# Patient Record
Sex: Male | Born: 1965 | Race: White | Hispanic: No | Marital: Married | Smoking: Never smoker
Health system: Southern US, Community
[De-identification: ages and names within clinical notes are randomized; demographics above are authoritative.]

## PROBLEM LIST (undated history)

## (undated) DIAGNOSIS — R12 Heartburn: Secondary | ICD-10-CM

## (undated) DIAGNOSIS — I10 Essential (primary) hypertension: Secondary | ICD-10-CM

## (undated) DIAGNOSIS — R202 Paresthesia of skin: Secondary | ICD-10-CM

## (undated) DIAGNOSIS — J301 Allergic rhinitis due to pollen: Secondary | ICD-10-CM

## (undated) DIAGNOSIS — M130 Polyarthritis, unspecified: Secondary | ICD-10-CM

## (undated) DIAGNOSIS — E739 Lactose intolerance, unspecified: Secondary | ICD-10-CM

## (undated) DIAGNOSIS — I7300 Raynaud's syndrome without gangrene: Secondary | ICD-10-CM

## (undated) DIAGNOSIS — R630 Anorexia: Secondary | ICD-10-CM

## (undated) DIAGNOSIS — R41840 Attention and concentration deficit: Secondary | ICD-10-CM

## (undated) DIAGNOSIS — E7800 Pure hypercholesterolemia, unspecified: Secondary | ICD-10-CM

## (undated) DIAGNOSIS — R5383 Other fatigue: Secondary | ICD-10-CM

## (undated) DIAGNOSIS — G629 Polyneuropathy, unspecified: Secondary | ICD-10-CM

## (undated) HISTORY — DX: Lactose intolerance, unspecified: E73.9

## (undated) HISTORY — DX: Raynaud's syndrome without gangrene: I73.00

## (undated) HISTORY — PX: PROSTATE BIOPSY: SHX241

## (undated) HISTORY — DX: Pure hypercholesterolemia, unspecified: E78.00

## (undated) HISTORY — PX: MOUTH SURGERY: SHX715

## (undated) HISTORY — DX: Essential (primary) hypertension: I10

## (undated) HISTORY — DX: Heartburn: R12

## (undated) HISTORY — DX: Anorexia: R63.0

## (undated) HISTORY — DX: Other fatigue: R53.83

## (undated) HISTORY — DX: Allergic rhinitis due to pollen: J30.1

## (undated) HISTORY — DX: Attention and concentration deficit: R41.840

## (undated) HISTORY — DX: Polyneuropathy, unspecified: G62.9

## (undated) HISTORY — DX: Paresthesia of skin: R20.2

## (undated) HISTORY — DX: Polyarthritis, unspecified: M13.0

---

## 1999-01-13 ENCOUNTER — Emergency Department (HOSPITAL_COMMUNITY): Admission: EM | Admit: 1999-01-13 | Discharge: 1999-01-13 | Payer: Self-pay | Admitting: Emergency Medicine

## 1999-01-13 ENCOUNTER — Encounter: Payer: Self-pay | Admitting: Emergency Medicine

## 1999-07-03 ENCOUNTER — Emergency Department (HOSPITAL_COMMUNITY): Admission: EM | Admit: 1999-07-03 | Discharge: 1999-07-03 | Payer: Self-pay | Admitting: Emergency Medicine

## 1999-07-03 ENCOUNTER — Encounter: Payer: Self-pay | Admitting: Emergency Medicine

## 2005-03-14 ENCOUNTER — Emergency Department (HOSPITAL_COMMUNITY): Admission: EM | Admit: 2005-03-14 | Discharge: 2005-03-14 | Payer: Self-pay | Admitting: Emergency Medicine

## 2006-05-26 ENCOUNTER — Encounter: Admission: RE | Admit: 2006-05-26 | Discharge: 2006-05-26 | Payer: Self-pay | Admitting: Internal Medicine

## 2013-08-29 ENCOUNTER — Other Ambulatory Visit: Payer: Self-pay | Admitting: Internal Medicine

## 2013-08-29 DIAGNOSIS — K30 Functional dyspepsia: Secondary | ICD-10-CM

## 2013-08-30 ENCOUNTER — Ambulatory Visit
Admission: RE | Admit: 2013-08-30 | Discharge: 2013-08-30 | Disposition: A | Discharge status: Home / Self Care | Payer: BC Managed Care – PPO | Source: Ambulatory Visit | Attending: Internal Medicine | Admitting: Internal Medicine

## 2013-08-30 DIAGNOSIS — K30 Functional dyspepsia: Secondary | ICD-10-CM

## 2017-10-16 ENCOUNTER — Emergency Department (HOSPITAL_COMMUNITY)
Admission: EM | Admit: 2017-10-16 | Discharge: 2017-10-16 | Disposition: A | Discharge status: Home / Self Care | Payer: BC Managed Care – PPO | Attending: Emergency Medicine | Admitting: Emergency Medicine

## 2017-10-16 ENCOUNTER — Other Ambulatory Visit: Payer: Self-pay

## 2017-10-16 ENCOUNTER — Encounter (HOSPITAL_COMMUNITY): Payer: Self-pay | Admitting: Emergency Medicine

## 2017-10-16 DIAGNOSIS — S61412A Laceration without foreign body of left hand, initial encounter: Secondary | ICD-10-CM | POA: Insufficient documentation

## 2017-10-16 DIAGNOSIS — Y929 Unspecified place or not applicable: Secondary | ICD-10-CM | POA: Insufficient documentation

## 2017-10-16 DIAGNOSIS — W260XXA Contact with knife, initial encounter: Secondary | ICD-10-CM | POA: Insufficient documentation

## 2017-10-16 DIAGNOSIS — Y998 Other external cause status: Secondary | ICD-10-CM | POA: Diagnosis not present

## 2017-10-16 DIAGNOSIS — Y9389 Activity, other specified: Secondary | ICD-10-CM | POA: Insufficient documentation

## 2017-10-16 MED ORDER — LIDOCAINE HCL (PF) 1 % IJ SOLN
10.0000 mL | Freq: Once | INTRAMUSCULAR | Status: AC
  Administered 2017-10-16: 10 mL
  Filled 2017-10-16: qty 10

## 2017-10-16 NOTE — Discharge Instructions (Signed)
Treatment: Keep your wound dry and dressing applied until this time tomorrow. After 24 hours, you may wash with warm soapy water. Dry and apply antibiotic ointment and clean dressing. Do this daily until your sutures are removed.  Wear your splint for the first 3 to 4 days to help prevent sutures pulling out.  Follow-up: Please follow-up with your primary care provider or return to emergency department in 10 days for suture removal. Be aware of signs of infection: fever, increasing pain, redness, swelling, drainage from the area. Please call your primary care provider or return to emergency department if you develop any of these symptoms or if any of the sutures come out prior to removal. Please return to the emergency department if you develop any other new or worsening symptoms.

## 2017-10-16 NOTE — ED Provider Notes (Signed)
[facility] EMERGENCY DEPARTMENT Provider Note   CSN: [id number] Arrival date & time: 10/16/17  1432     History   Chief Complaint Chief Complaint  Patient presents with  . Extremity Laceration    HPI Hector Short is a 52 y.o. male who is previously healthy who presents with left hand laceration.  Patient reports he was carving wooden bird with about a 1 inch gauge with his right hand and slipped and hit his left hand.  The gauge was clean and he denies possibility of foreign body.  He had trouble getting the bleeding stopping at home.   He denies any difficulty moving his thumb or fingers, numbness or tingling. His tetanus is up-to-date.  Patient reports when he saw the blood, he became clammy and sweaty, felt tired, and passed out for a short time.  He reports that has happened before when he has seen blood.  He feels back to normal now.  HPI  History reviewed. No pertinent past medical history.  There are no active problems to display for this patient.   History reviewed. No pertinent surgical history.      Home Medications    Prior to Admission medications   Not on File    Family History No family history on file.  Social History Social History   Tobacco Use  . Smoking status: Never Smoker  . Smokeless tobacco: Never Used  Substance Use Topics  . Alcohol use: Yes  . Drug use: Not Currently     Allergies   Patient has no allergy information on record.   Review of Systems Review of Systems  Musculoskeletal: Negative for arthralgias.  Skin: Positive for wound.  Neurological: Negative for numbness.     Physical Exam Updated Vital Signs BP 117/81 (BP Location: Right Arm)   Pulse 66   Temp 98 F (36.7 C) (Oral)   Resp 16   Ht 5\' 6"  (1.676 m)   Wt 77.1 kg   SpO2 100%   BMI 27.44 kg/m   Physical Exam  Constitutional: He appears well-developed and well-nourished. No distress.  HENT:  Head: Normocephalic and  atraumatic.  Mouth/Throat: Oropharynx is clear and moist. No oropharyngeal exudate.  Eyes: Pupils are equal, round, and reactive to light. Conjunctivae are normal. Right eye exhibits no discharge. Left eye exhibits no discharge. No scleral icterus.  Neck: Normal range of motion. Neck supple. No thyromegaly present.  Cardiovascular: Normal rate, regular rhythm, normal heart sounds and intact distal pulses. Exam reveals no gallop and no friction rub.  No murmur heard. Pulmonary/Chest: Effort normal and breath sounds normal. No stridor. No respiratory distress. He has no wheezes. He has no rales.  Musculoskeletal: He exhibits no edema.       Hands: 1 inch laceration as below, full range of motion of the thumb and all digits with flexion, extension, abduction, abduction, sensation intact, cap refill less than 2 seconds, no bony tenderness; radial pulses intact  Lymphadenopathy:    He has no cervical adenopathy.  Neurological: He is alert. Coordination normal.  Skin: Skin is warm and dry. No rash noted. He is not diaphoretic. No pallor.  Psychiatric: He has a normal mood and affect.  Nursing note and vitals reviewed.    ED Treatments / Results  Labs (all labs ordered are listed, but only abnormal results are displayed) Labs Reviewed - No data to display  EKG None  Radiology No results found.  Procedures .Marland KitchenLaceration Repair Date/Time: 10/16/2017 4:06 PM  Performed by: Frederica Kuster, PA-C Authorized by: Frederica Kuster, PA-C   Consent:    Consent obtained:  Verbal   Consent given by:  Patient   Risks discussed:  Infection, pain and poor cosmetic result   Alternatives discussed:  No treatment Anesthesia (see MAR for exact dosages):    Anesthesia method:  Local infiltration   Local anesthetic:  Lidocaine 1% w/o epi Laceration details:    Location:  Hand   Hand location:  L palm   Length (cm):  2.5 Repair type:    Repair type:  Simple Pre-procedure details:    Preparation:   Patient was prepped and draped in usual sterile fashion Exploration:    Hemostasis achieved with:  Direct pressure   Wound exploration: wound explored through full range of motion and entire depth of wound probed and visualized     Wound extent: no foreign bodies/material noted, no muscle damage noted, no nerve damage noted and no tendon damage noted     Contaminated: no   Treatment:    Area cleansed with:  Saline   Amount of cleaning:  Standard   Irrigation solution:  Sterile saline   Irrigation volume:  200 mL   Irrigation method:  Syringe   Visualized foreign bodies/material removed: no   Skin repair:    Repair method:  Sutures   Suture size:  5-0   Wound skin closure material used: Ethilon.   Suture technique:  Simple interrupted   Number of sutures:  5 Approximation:    Approximation:  Close Post-procedure details:    Dressing:  Antibiotic ointment, splint for protection and non-adherent dressing (gauze roll)   Patient tolerance of procedure:  Tolerated well, no immediate complications   (including critical care time)  Medications Ordered in ED Medications  lidocaine (PF) (XYLOCAINE) 1 % injection 10 mL (has no administration in time range)     Initial Impression / Assessment and Plan / ED Course  I have reviewed the triage vital signs and the nursing notes.  Pertinent labs & imaging results that were available during my care of the patient were reviewed by me and considered in my medical decision making (see chart for details).     Patient with laceration to the webspace between his thumb and index finger on the left hand tetanus UTD.  There is normal range of motion and full depth of wound visualized in a bloodless field without tendon or vessel damage.  Sensation intact.  Laceration occurred < 12 hours prior to repair. Discussed laceration care with pt and answered questions.  Patient placed in thumb spica splint for protection.  Pt to f-u for suture removal in 10 days  and wound check sooner should there be signs of dehiscence or infection.  Patient understands and agrees with plan.  Patient vitals stable throughout ED course and discharged in satisfactory condition.  Pt is hemodynamically stable with no complaints prior to dc.     Final Clinical Impressions(s) / ED Diagnoses   Final diagnoses:  Laceration of left hand without foreign body, initial encounter    ED Discharge Orders    None       Frederica Kuster, PA-C 10/16/17 1610    Julianne Rice, MD 10/24/17 [phone]

## 2017-10-16 NOTE — ED Triage Notes (Signed)
Pt. Stated, I cut my hand at the bottom of the thumb about a hour ago. About 1 Inch.  Laceration between thumb and index finger.

## 2018-06-16 ENCOUNTER — Other Ambulatory Visit: Payer: Self-pay | Admitting: Internal Medicine

## 2018-06-16 ENCOUNTER — Ambulatory Visit
Admission: RE | Admit: 2018-06-16 | Discharge: 2018-06-16 | Disposition: A | Discharge status: Home / Self Care | Payer: BC Managed Care – PPO | Source: Ambulatory Visit | Attending: Internal Medicine | Admitting: Internal Medicine

## 2018-06-16 DIAGNOSIS — R05 Cough: Secondary | ICD-10-CM

## 2018-06-16 DIAGNOSIS — R059 Cough, unspecified: Secondary | ICD-10-CM

## 2020-08-27 ENCOUNTER — Ambulatory Visit
Admission: RE | Admit: 2020-08-27 | Discharge: 2020-08-27 | Disposition: A | Discharge status: Home / Self Care | Payer: BC Managed Care – PPO | Source: Ambulatory Visit | Attending: Physician Assistant | Admitting: Physician Assistant

## 2020-08-27 ENCOUNTER — Other Ambulatory Visit: Payer: Self-pay

## 2020-08-27 ENCOUNTER — Other Ambulatory Visit: Payer: Self-pay | Admitting: Physician Assistant

## 2020-08-27 DIAGNOSIS — M5412 Radiculopathy, cervical region: Secondary | ICD-10-CM

## 2020-09-02 ENCOUNTER — Other Ambulatory Visit: Payer: Self-pay

## 2020-09-02 ENCOUNTER — Other Ambulatory Visit: Payer: Self-pay | Admitting: Internal Medicine

## 2020-09-02 ENCOUNTER — Ambulatory Visit
Admission: RE | Admit: 2020-09-02 | Discharge: 2020-09-02 | Disposition: A | Discharge status: Home / Self Care | Payer: BC Managed Care – PPO | Source: Ambulatory Visit | Attending: Internal Medicine | Admitting: Internal Medicine

## 2020-09-02 DIAGNOSIS — R5382 Chronic fatigue, unspecified: Secondary | ICD-10-CM

## 2020-10-03 ENCOUNTER — Other Ambulatory Visit: Payer: Self-pay | Admitting: Internal Medicine

## 2020-10-03 DIAGNOSIS — R200 Anesthesia of skin: Secondary | ICD-10-CM

## 2020-10-18 ENCOUNTER — Other Ambulatory Visit: Payer: Self-pay

## 2020-10-18 ENCOUNTER — Ambulatory Visit
Admission: RE | Admit: 2020-10-18 | Discharge: 2020-10-18 | Disposition: A | Discharge status: Home / Self Care | Payer: BC Managed Care – PPO | Source: Ambulatory Visit | Attending: Internal Medicine | Admitting: Internal Medicine

## 2020-10-18 DIAGNOSIS — R200 Anesthesia of skin: Secondary | ICD-10-CM

## 2020-10-18 MED ORDER — GADOBENATE DIMEGLUMINE 529 MG/ML IV SOLN
15.0000 mL | Freq: Once | INTRAVENOUS | Status: AC | PRN
  Administered 2020-10-18: 15 mL via INTRAVENOUS

## 2020-12-08 ENCOUNTER — Telehealth: Payer: Self-pay | Admitting: Neurology

## 2020-12-08 NOTE — Telephone Encounter (Signed)
Dr Laurann Montana (who states he has spoken with Dr Rexene Alberts about this pt once before) he is asking for a call from Dr Rexene Alberts.  He is asking to be called at [phone] he states it is not urgent.

## 2021-01-22 ENCOUNTER — Ambulatory Visit: Payer: BC Managed Care – PPO | Admitting: Neurology

## 2021-01-22 ENCOUNTER — Other Ambulatory Visit: Payer: Self-pay

## 2021-01-22 ENCOUNTER — Encounter: Payer: Self-pay | Admitting: Neurology

## 2021-01-22 VITALS — BP 124/80 | HR 66 | Ht 66.0 in | Wt 161.8 lb

## 2021-01-22 DIAGNOSIS — R202 Paresthesia of skin: Secondary | ICD-10-CM | POA: Diagnosis not present

## 2021-01-22 DIAGNOSIS — D485 Neoplasm of uncertain behavior of skin: Secondary | ICD-10-CM | POA: Insufficient documentation

## 2021-01-22 DIAGNOSIS — I781 Nevus, non-neoplastic: Secondary | ICD-10-CM | POA: Insufficient documentation

## 2021-01-22 DIAGNOSIS — D2370 Other benign neoplasm of skin of unspecified lower limb, including hip: Secondary | ICD-10-CM | POA: Insufficient documentation

## 2021-01-22 DIAGNOSIS — L738 Other specified follicular disorders: Secondary | ICD-10-CM | POA: Insufficient documentation

## 2021-01-22 DIAGNOSIS — R209 Unspecified disturbances of skin sensation: Secondary | ICD-10-CM | POA: Insufficient documentation

## 2021-01-22 DIAGNOSIS — D225 Melanocytic nevi of trunk: Secondary | ICD-10-CM | POA: Insufficient documentation

## 2021-01-22 DIAGNOSIS — G9332 Myalgic encephalomyelitis/chronic fatigue syndrome: Secondary | ICD-10-CM | POA: Insufficient documentation

## 2021-01-22 DIAGNOSIS — J301 Allergic rhinitis due to pollen: Secondary | ICD-10-CM | POA: Insufficient documentation

## 2021-01-22 DIAGNOSIS — R630 Anorexia: Secondary | ICD-10-CM | POA: Insufficient documentation

## 2021-01-22 DIAGNOSIS — R41840 Attention and concentration deficit: Secondary | ICD-10-CM | POA: Insufficient documentation

## 2021-01-22 DIAGNOSIS — E739 Lactose intolerance, unspecified: Secondary | ICD-10-CM | POA: Insufficient documentation

## 2021-01-22 NOTE — Progress Notes (Addendum)
Chief Complaint  Patient presents with   New Patient (Initial Visit)    Rm 17. Alone. 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      ASSESSMENT AND PLAN  Hector Short is a 56 y.o. male  Intermittent paresthesia  Laboratory evaluations, including inflammatory markers, Lyme titer  Continue to observe his symptoms,  Addendum: Reviewed laboratory evaluation from [place] in February 2023, normal CBC, hemoglobin of 14.3, CMP, creatinine of 0.95, ESR of 1, TSH of 0.95, normal C-reactive protein,   DIAGNOSTIC DATA (LABS, IMAGING, TESTING) - I reviewed patient records, labs, notes, testing and imaging myself where available.   MEDICAL HISTORY:  Hector Short is a 56 year old male, seen in request by his primary care physician Dr. Laurann Montana, Jenny Reichmann, for evaluation of intermittent paresthesia, regular MRI of brain, initial evaluation was from January 22, 2021   I reviewed and summarized the referring note. 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  PHYSICAL EXAMNIATION:  Gen: NAD, conversant, well nourised, well groomed                     Cardiovascular: Regular rate rhythm, no peripheral edema, warm, nontender. Eyes: Conjunctivae clear without exudates or hemorrhage Neck: Supple, no carotid bruits. Pulmonary: Clear to auscultation bilaterally   NEUROLOGICAL EXAM:  MENTAL STATUS: Speech:    Speech is normal; fluent and spontaneous with normal comprehension.  Cognition:     Orientation to time, place and person     Normal recent and remote memory     Normal Attention span and concentration     Normal Language, naming, repeating,spontaneous speech     Fund of knowledge   CRANIAL NERVES: CN II: Visual fields are full to confrontation. Pupils are round equal and briskly reactive to light. CN III, IV, VI: extraocular movement are normal. No ptosis. CN V: Facial sensation is intact to light touch CN VII: Face is symmetric with normal eye closure  CN VIII: Hearing is normal to causal conversation. CN IX, X: Phonation is normal. CN XI: Head turning and shoulder shrug are intact  MOTOR: There is no pronator drift of out-stretched arms. Muscle bulk and tone are normal. Muscle strength is normal.  REFLEXES: Reflexes are 2+ and symmetric at the biceps, triceps, knees, and ankles. Plantar responses are flexor.  SENSORY: Intact to light  touch, pinprick and vibratory sensation are intact in fingers and toes.  COORDINATION: There is no trunk or limb dysmetria noted.  GAIT/STANCE: Posture is normal. Gait is steady with normal steps, base, arm swing, and turning. Heel and toe walking are normal. Tandem gait is normal.  Romberg is absent.  REVIEW OF  SYSTEMS:  Full 14 system review of systems performed and notable only for as above All other review of systems were negative.   ALLERGIES: Allergies  Allergen Reactions   Aspirin Other (See Comments)   Cephalexin     Other reaction(s): anaphylaxis   Penicillin G     Other reaction(s): Unknown   Penicillin G Benzathine     Other reaction(s): anaphylaxis    HOME MEDICATIONS: No current outpatient medications on file.   No current facility-administered medications for this visit.    PAST MEDICAL HISTORY: History reviewed. No pertinent past medical history.  PAST SURGICAL HISTORY: History reviewed. No pertinent surgical history.  FAMILY HISTORY: History reviewed. No pertinent family history.  SOCIAL HISTORY: Social History   Socioeconomic History   Marital status: Married    Spouse name: Not on file   Number of children: Not on file   Years of education: Not on file   Highest education level: Not on file  Occupational History   Not on file  Tobacco Use   Smoking status: Never   Smokeless tobacco: Never  Substance and Sexual Activity   Alcohol use: Not Currently   Drug use: Not Currently   Sexual activity: Not on file  Other Topics Concern   Not on file  Social History Narrative   Not on file   Social Determinants of Health   Financial Resource Strain: Not on file  Food Insecurity: Not on file  Transportation Needs: Not on file  Physical Activity: Not on file  Stress: Not on file  Social Connections: Not on file  Intimate Partner Violence: Not on file      Marcial Pacas, M.D. Ph.D.  [facility] [address], [place], [place] [address] Ph: [phone] Fax: [phone]  CC:  Lavone Orn, MD [place] [address] [place],   [address]  Lavone Orn, MD  +++++++++++++++++++++++++++++++++++++++++++++++++++++++++++++++++++++++++++++++++++++++++

## 2021-01-23 LAB — C-REACTIVE PROTEIN: CRP: 1 mg/L (ref 0–10)

## 2021-01-23 LAB — ANA W/REFLEX IF POSITIVE: Anti Nuclear Antibody (ANA): NEGATIVE

## 2021-01-23 LAB — LYME DISEASE SEROLOGY W/REFLEX: Lyme Total Antibody EIA: NEGATIVE

## 2021-01-23 LAB — SEDIMENTATION RATE: Sed Rate: 2 mm/hr (ref 0–30)

## 2021-01-27 ENCOUNTER — Telehealth: Payer: Self-pay | Admitting: *Deleted

## 2021-01-27 NOTE — Telephone Encounter (Signed)
Pt verified by name and DOB, results given per provider, pt voiced understanding all question answered. 

## 2021-01-27 NOTE — Telephone Encounter (Signed)
-----   Message from Marcial Pacas, MD sent at 01/26/2021  5:16 PM EST ----- Please call patient, laboratory evaluation showed no significant abnormalities.

## 2021-03-02 ENCOUNTER — Telehealth: Payer: Self-pay | Admitting: Neurology

## 2021-03-02 NOTE — Telephone Encounter (Signed)
I was able to talk with his PCP Dr. Lavone Orn, patient recently began to complains of constellation of symptoms, including paresthesia of limbs, even face, difficulty focusing. He is the Magazine features editor of biology department  Previous laboratory evaluation was normal,  Please give him a follow-up visit with me

## 2021-03-10 NOTE — Telephone Encounter (Signed)
Pt's wife has been called, the appointment has been scheduled with pt on wait list, wife is asking to be called on her cell @ [phone] if other appointments become available before.

## 2021-05-04 ENCOUNTER — Ambulatory Visit: Payer: BC Managed Care – PPO | Admitting: Neurology

## 2021-12-27 IMAGING — DX DG CERVICAL SPINE 2 OR 3 VIEWS
3 series · 3 of 3 positions shown · non-contrast
Comparison: None.

CLINICAL DATA: Cervical radiculopathy.

EXAM:
CERVICAL SPINE - 2-3 VIEW

[dg cervical spine 2 or 3 views (1 of 3)]
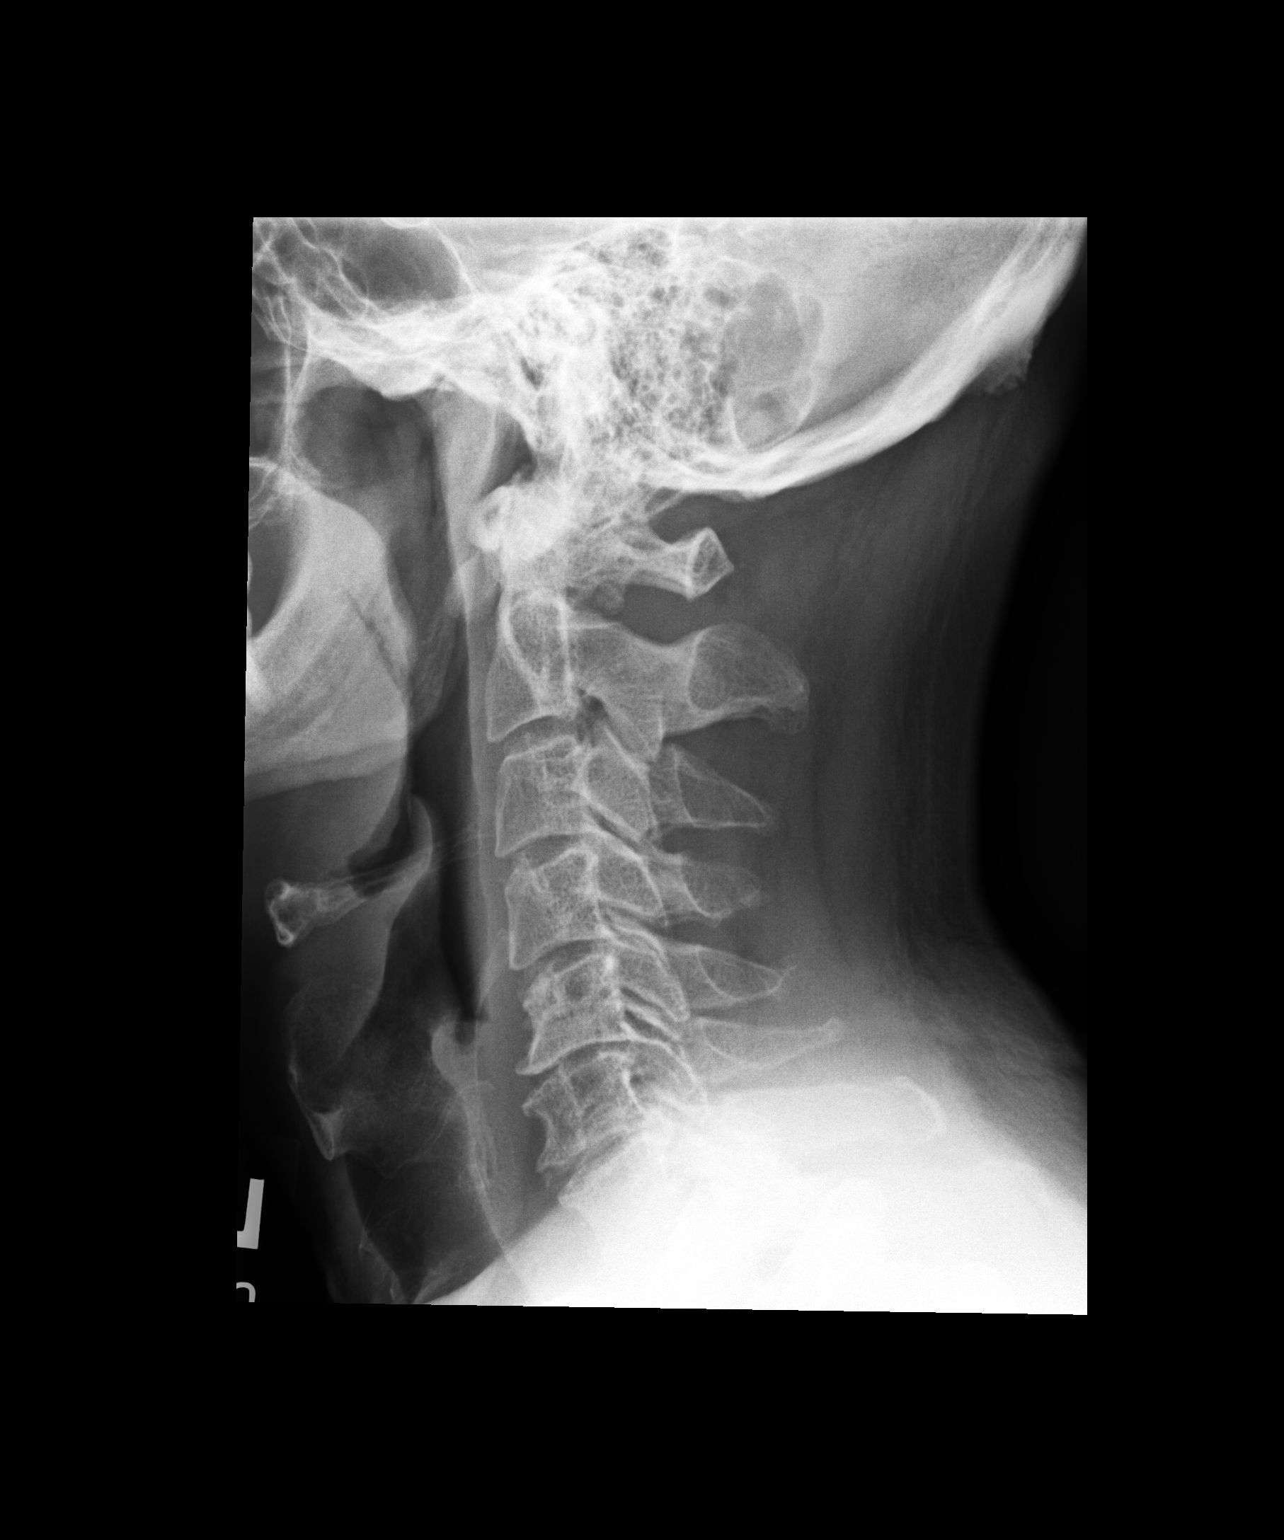

[dg cervical spine 2 or 3 views (2 of 3)]
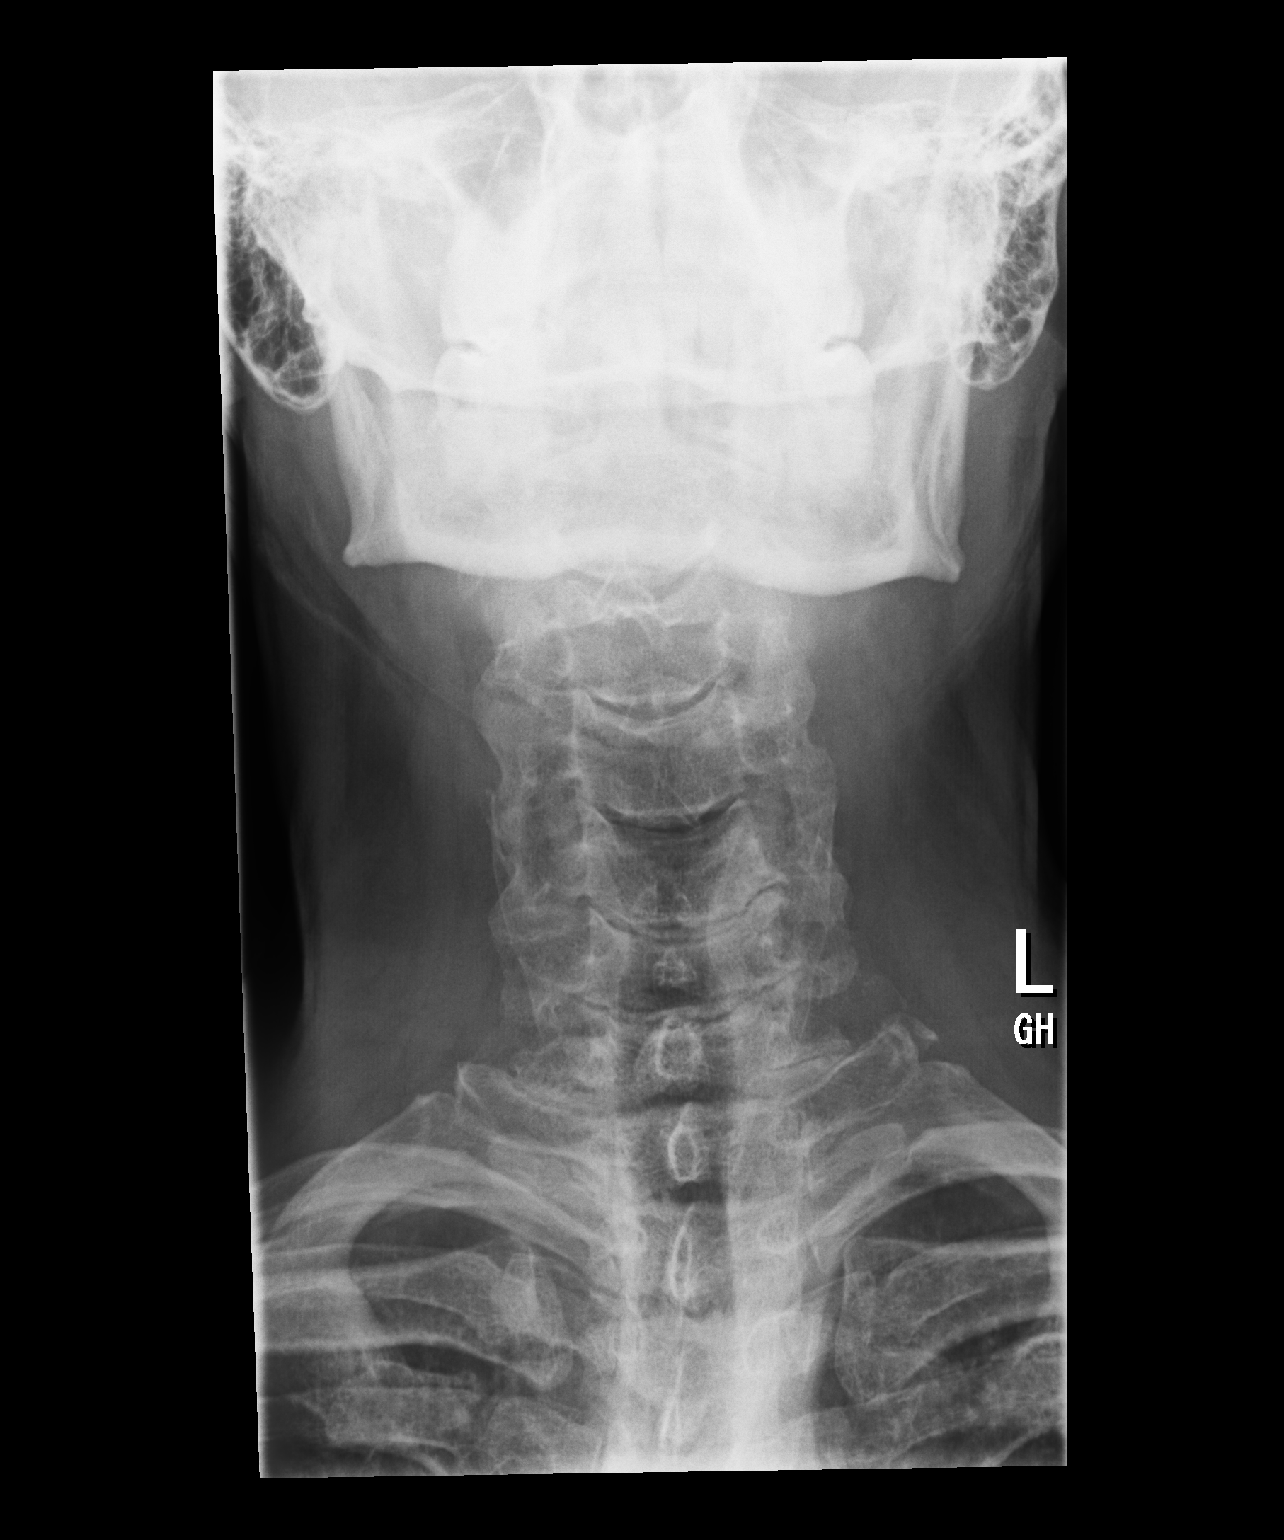

[dg cervical spine 2 or 3 views (3 of 3)]
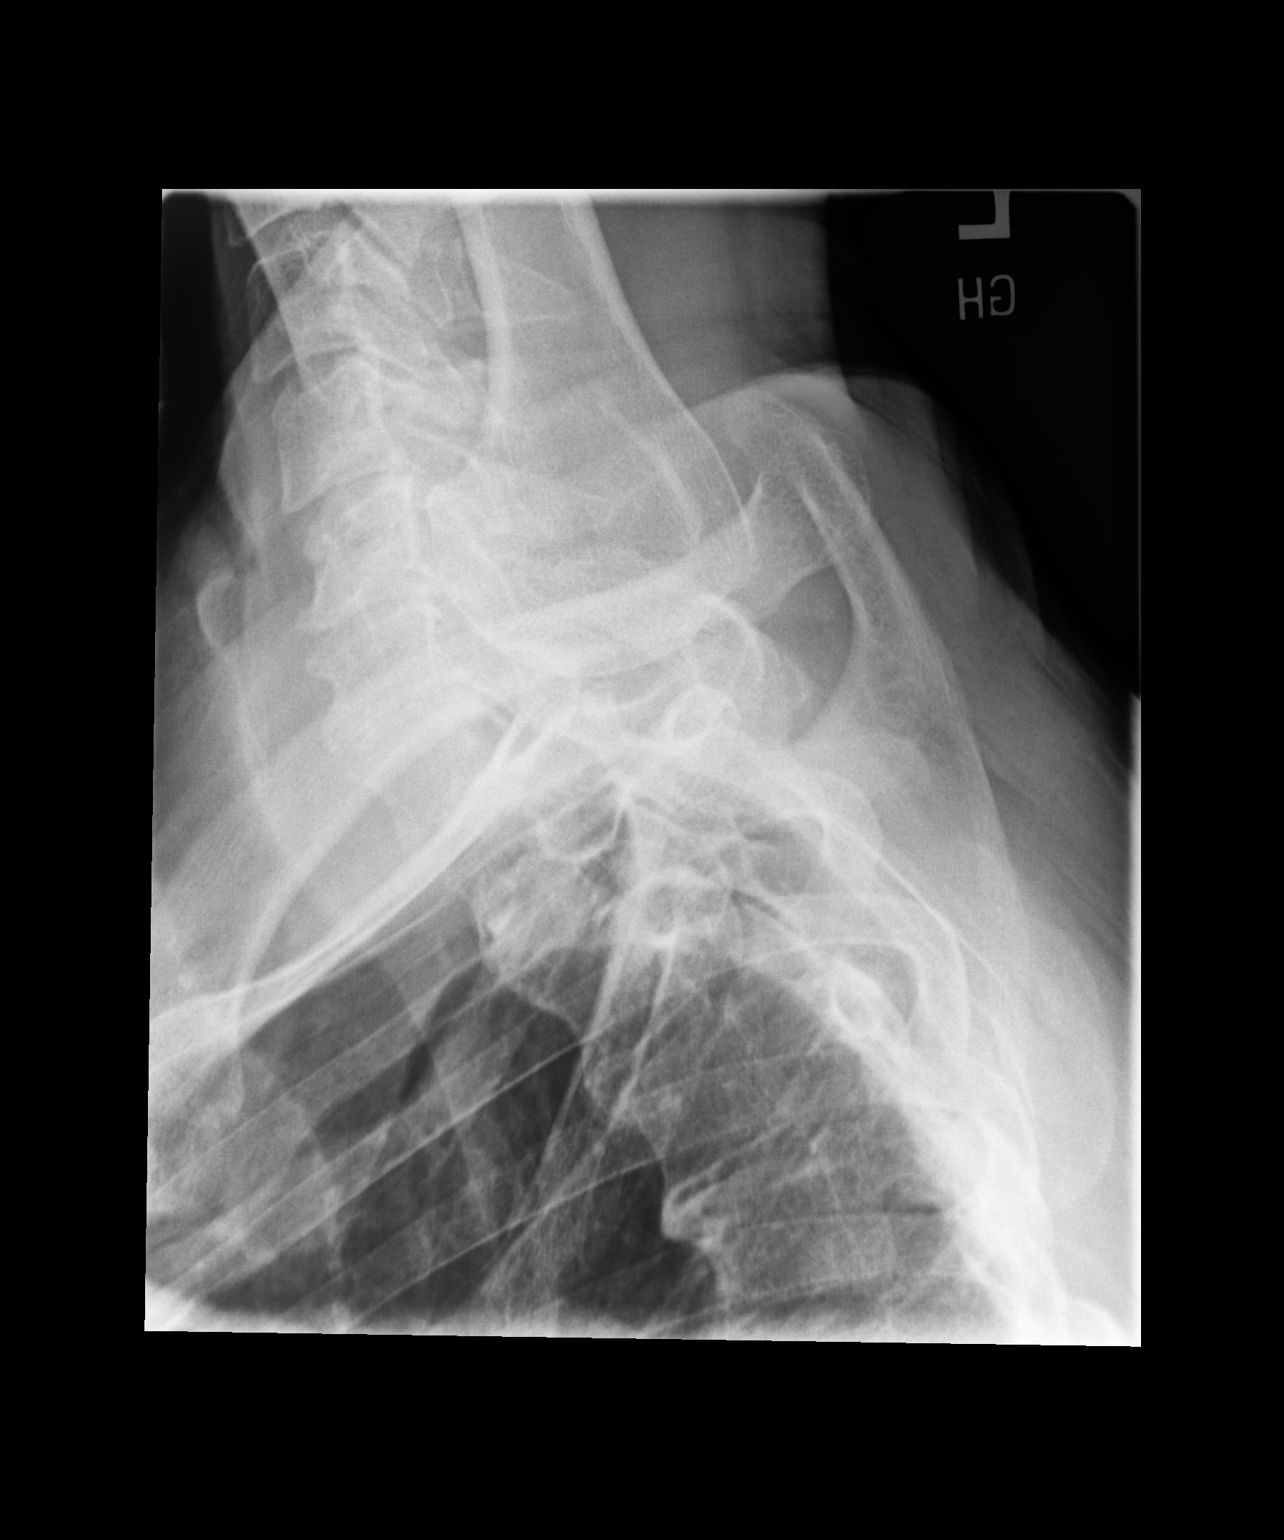

[3 of 3 positions shown; findings below may reference images not displayed]

FINDINGS: There is no acute fracture or subluxation of the cervical spine.
Multilevel degenerative changes with disc space narrowing and
endplate irregularity and spurring most prominent at C5-C6 and
C6-C7. The visualized posterior elements appear intact. The soft
tissues are unremarkable.
IMPRESSION: 1. No acute findings.
2. Multilevel degenerative changes.

## 2022-01-02 IMAGING — CR DG CHEST 2V
2 series · 2 of 2 positions shown · non-contrast
Comparison: June 16, 2018.

CLINICAL DATA: Chronic fatigue.

EXAM:
CHEST - 2 VIEW

[w chest pa]
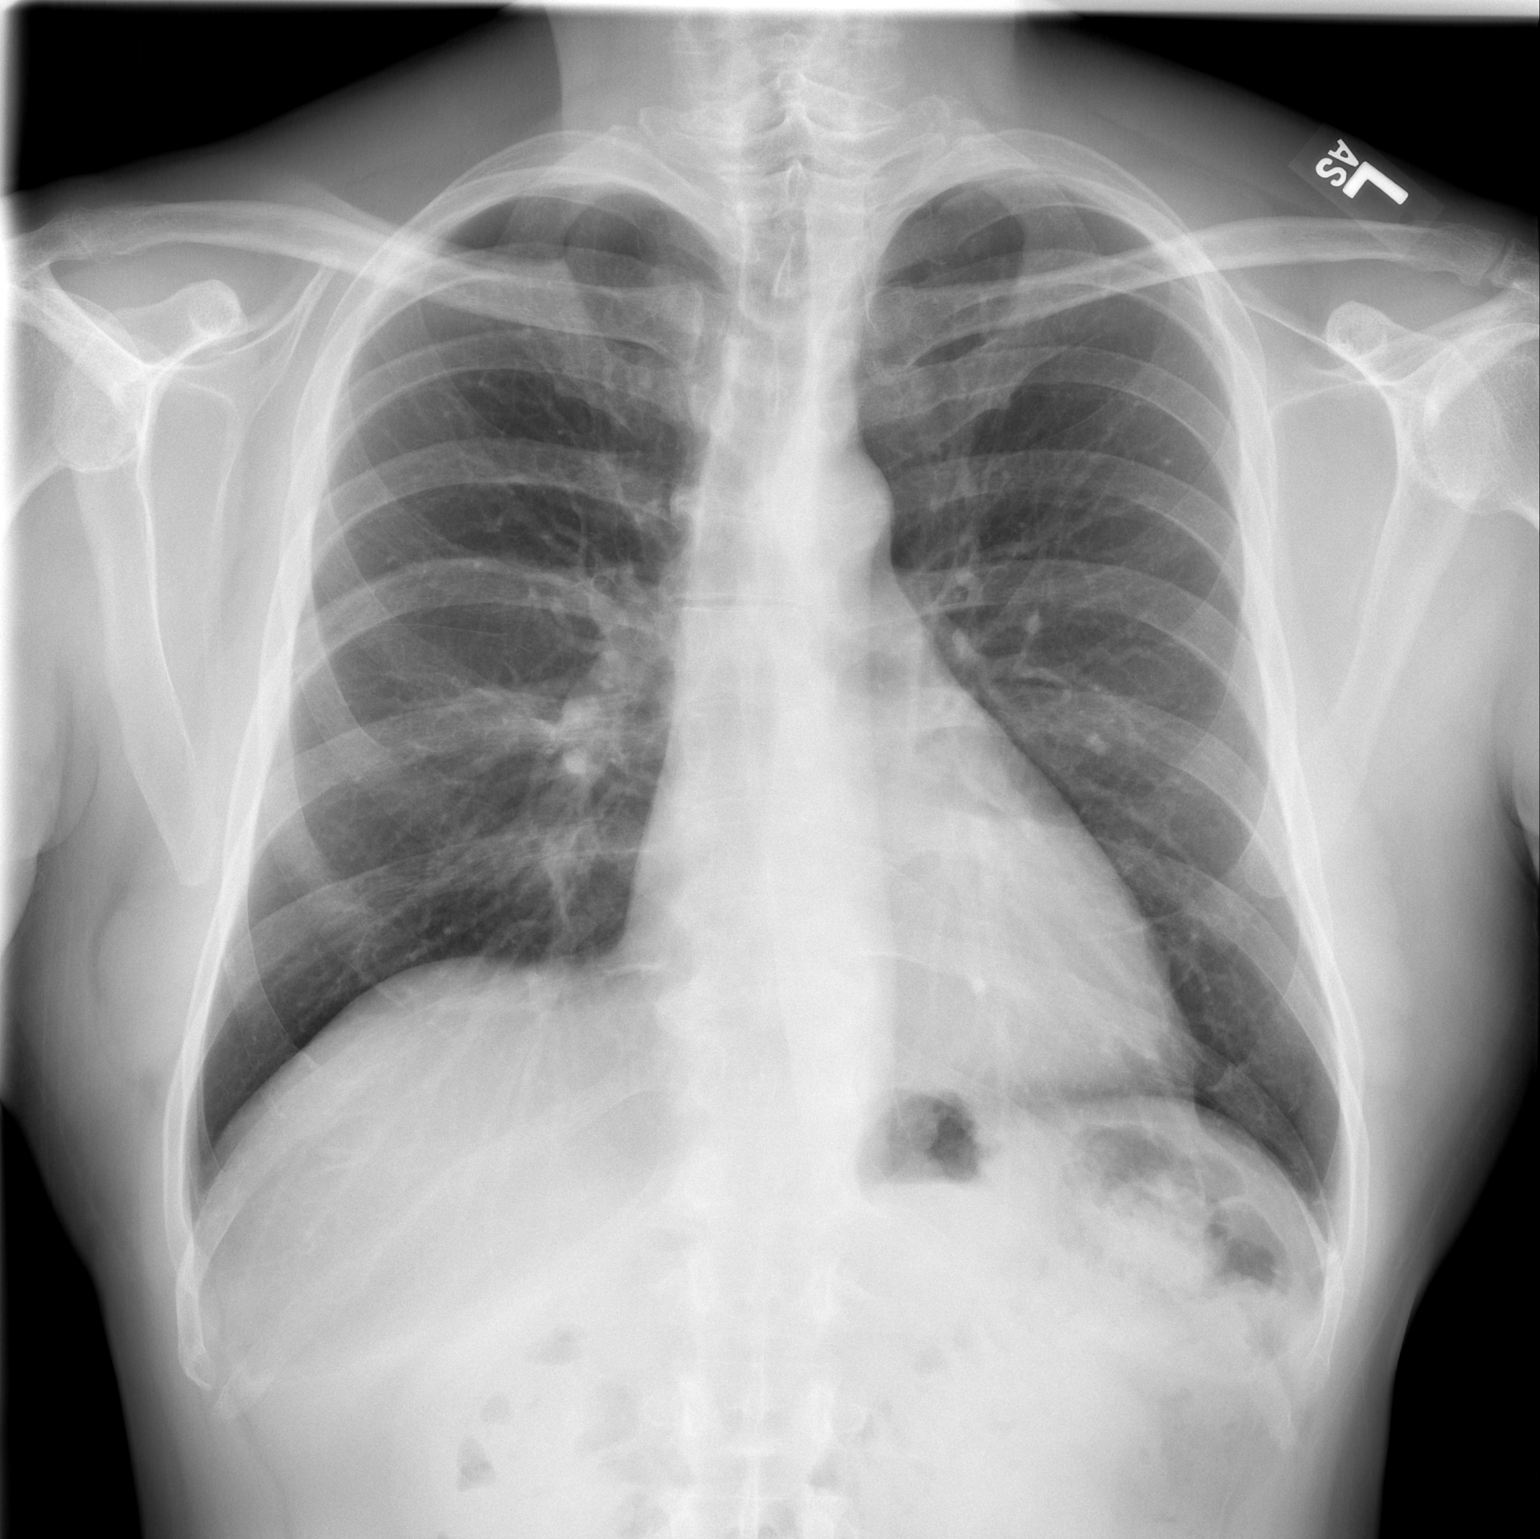

[w chest lat]
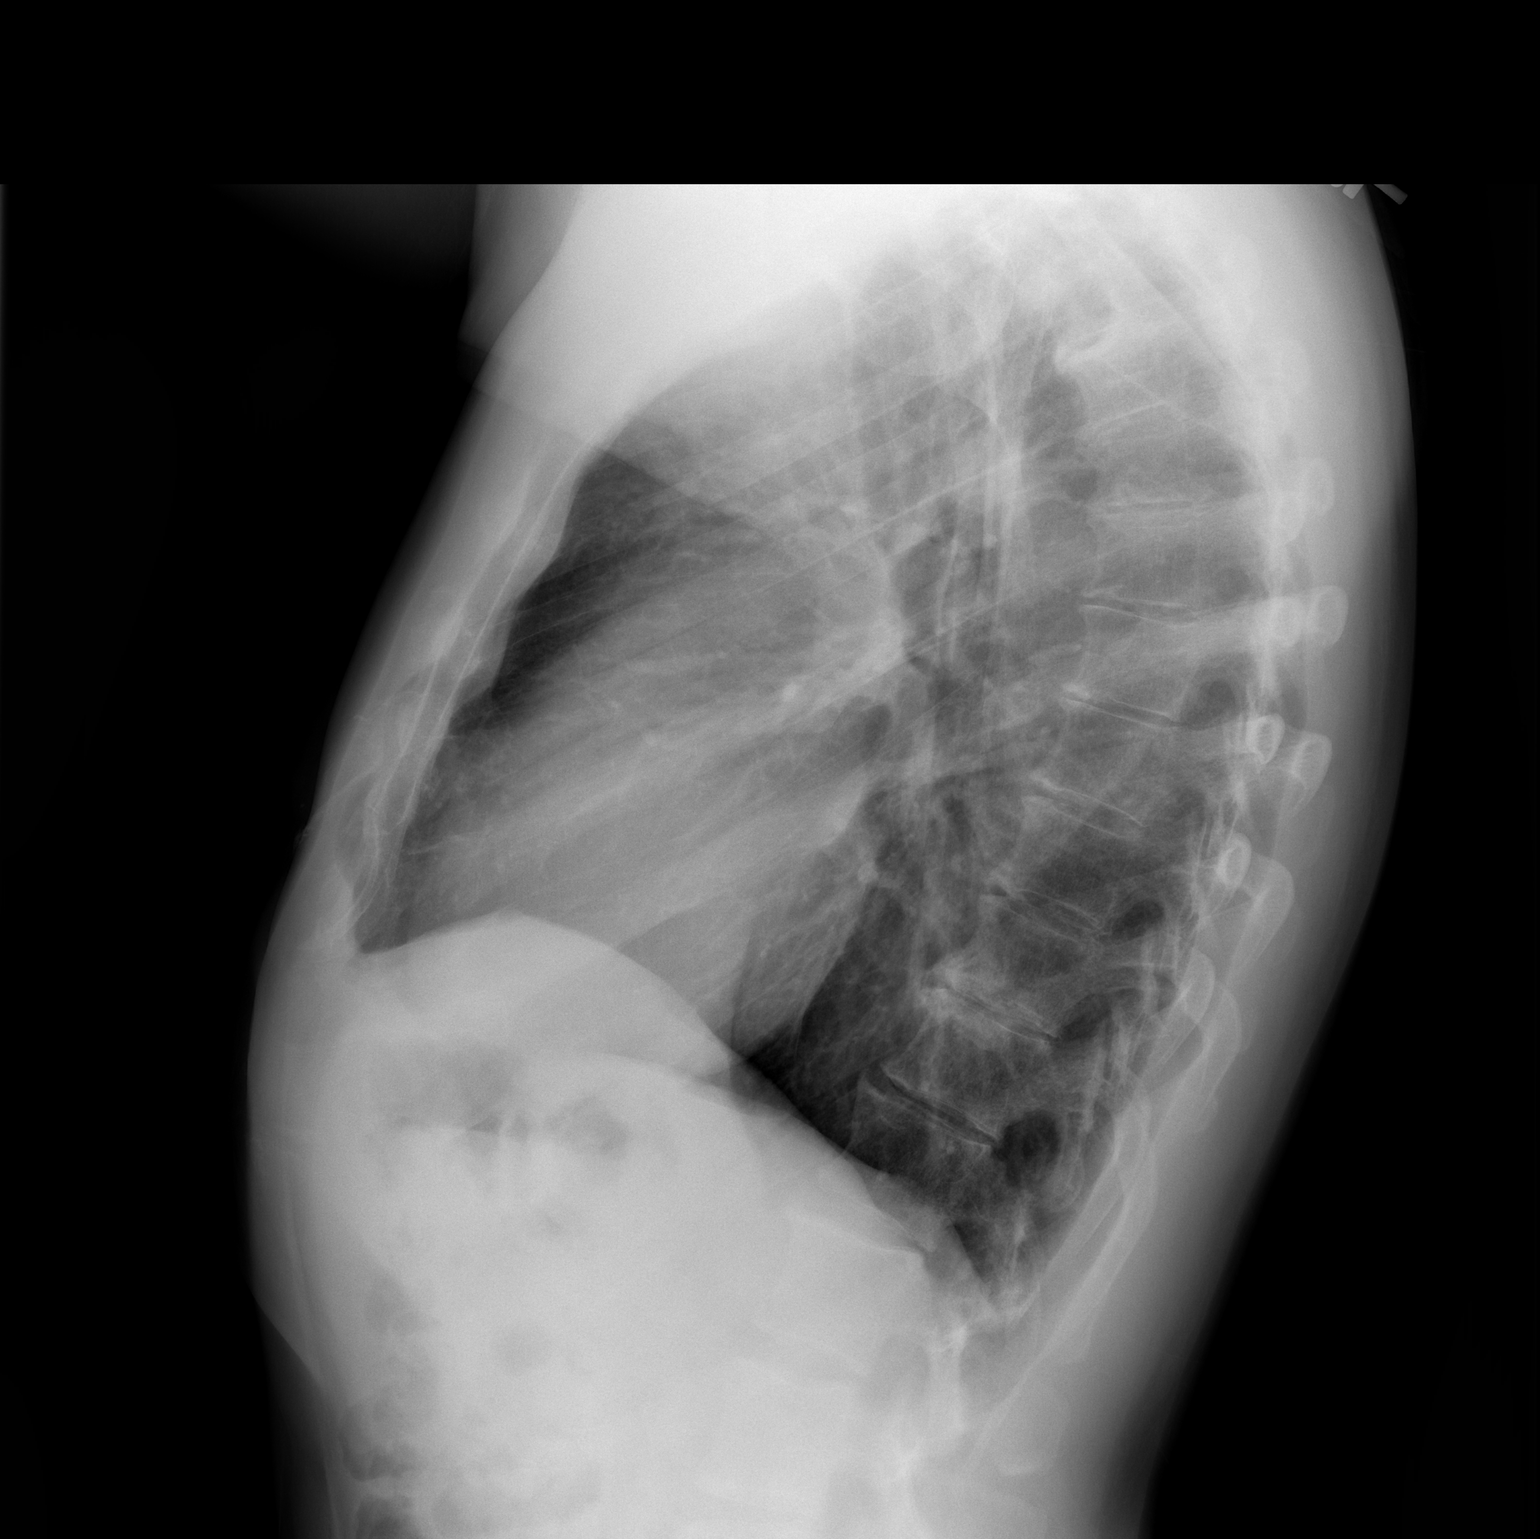

[2 of 2 positions shown; findings below may reference images not displayed]

FINDINGS: The heart size and mediastinal contours are within normal limits.
Both lungs are clear. The visualized skeletal structures are
unremarkable.
IMPRESSION: No active cardiopulmonary disease.

## 2022-03-02 ENCOUNTER — Other Ambulatory Visit: Payer: Self-pay

## 2022-03-02 ENCOUNTER — Encounter: Payer: Self-pay | Admitting: Physical Therapy

## 2022-03-02 ENCOUNTER — Ambulatory Visit: Payer: BC Managed Care – PPO | Attending: Neurology | Admitting: Physical Therapy

## 2022-03-02 DIAGNOSIS — R2689 Other abnormalities of gait and mobility: Secondary | ICD-10-CM | POA: Diagnosis present

## 2022-03-02 DIAGNOSIS — R2681 Unsteadiness on feet: Secondary | ICD-10-CM | POA: Insufficient documentation

## 2022-03-02 DIAGNOSIS — M6281 Muscle weakness (generalized): Secondary | ICD-10-CM | POA: Insufficient documentation

## 2022-03-02 NOTE — Therapy (Signed)
OUTPATIENT PHYSICAL THERAPY NEURO EVALUATION   Patient Name: Hector Short MRN: [id number] DOB:May 02, 1965, 57 y.o., male Today's Date: 03/02/2022   PCP: Lavone Orn, MD  REFERRING PROVIDER: Concepcion Living, MD   END OF SESSION:  PT End of Session - 03/02/22 0842     Visit Number 1    Number of Visits 12    Date for PT Re-Evaluation 04/09/22    Authorization Type BCBS    PT Start Time [phone]    PT Stop Time 0932    PT Time Calculation (min) 48 min    Activity Tolerance Patient tolerated treatment well    Behavior During Therapy [facility] for tasks assessed/performed             History reviewed. No pertinent past medical history. History reviewed. No pertinent surgical history. Patient Active Problem List   Diagnosis Date Noted   Allergic rhinitis due to pollen 01/22/2021   Attention and concentration deficit 01/22/2021   Benign neoplasm of skin of lower limb 01/22/2021   Chronic fatigue syndrome 01/22/2021   Lactose intolerance 01/22/2021   Loss of appetite 01/22/2021   Melanocytic nevi of trunk 01/22/2021   Neoplasm of uncertain behavior of skin 01/22/2021   Sebaceous hyperplasia 01/22/2021   Skin sensation disturbance 01/22/2021   Telangiectasia 01/22/2021   Paresthesia 01/22/2021    ONSET DATE: 12/30/2021 MD referral  REFERRING DIAG: M79.10 (ICD-10-CM) - Myalgia, unspecified site  THERAPY DIAG:  Muscle weakness (generalized)  Other abnormalities of gait and mobility  Unsteadiness on feet  Rationale for Evaluation and Treatment: Rehabilitation  SUBJECTIVE:                                                                                                                                                                                             SUBJECTIVE STATEMENT: Have had weird loss of sensation in fingers and feet.  Front part of toes have lack of sensation, especially on R side.  Have some pulling in my L quad.  Have odd sensations in my  quads.  Do yoga every day and that helps.   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  Per MD note:  family history of Charcot Marie-Tooth disease and polio; per pt report, family hx of ALS  PAIN:  Are you having pain? Yes: NPRS scale: 4/10 Pain location: tops of feet, radiates up my foot Pain description: burning Aggravating factors: walking Relieving factors: rest, taking pressure off feet  PRECAUTIONS: Fall  WEIGHT BEARING RESTRICTIONS: No  FALLS: Has patient fallen in last  6 months? No  LIVING ENVIRONMENT: Lives with: lives with their spouse Lives in: House/apartment Stairs: Yes: External: 2 steps; none Has following equipment at home: None  PLOF: Independent and Leisure: does yoga and works out mostly daily [employer]; is a Physiological scientist at [employer].  PATIENT GOALS: Pt's goals for therapy are to correct walking pattern to avoid compensations.  To improve flexibility.  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  Leg length measured below, from greater trochanter to medial malleolus 32 LLE 31.5 in RLE  LOWER EXTREMITY ROM:   A/ROM WFL in sitting; tends to have increased LLE internal rotation of foot in sitting.  LOWER EXTREMITY MMT:  MMT Right Eval Left Eval  Hip flexion 4+ 4  Hip extension    Hip abduction 4 4  Hip adduction    Hip internal rotation    Hip external rotation    Knee flexion 4+ 4+  Knee extension 4 4  Ankle dorsiflexion 4+ 4  Ankle plantarflexion 4 (18) 3   (4)   Ankle inversion 4 3+  Ankle eversion 4 3+   (Blank rows = not tested)     BED MOBILITY:  Independent  TRANSFERS: Assistive device utilized: None  Sit to stand: Modified independence; not full knee extension with 5x sit<>stand Stand to sit: Modified independence  STAIRS: Level of Assistance: Modified independence Stair Negotiation Technique: Step to Pattern Alternating Pattern  with Single Rail on Right Number of Stairs: 2-3  Height of Stairs: 4-6"  Comments: prefers step-to pattern  GAIT: Gait pattern:  toeing in L>R, decreased push-off BLEs, step through pattern, decreased step length- Right, decreased step length- Left, decreased stance time- Left, decreased stride length, knee flexed in stance- Right, knee flexed in stance- Left, antalgic, and narrow BOS Distance walked: 50 ft x 3 reps Assistive device utilized: None Level of assistance: Modified independence  FUNCTIONAL TESTS:  5 times sit to stand: 9.88 sec 10 meter walk test: 11.09 sec = 2.96 ft/sec Dynamic Gait Index: 15/24 (Scores <19/24 indicate increased fall risk   [facility] PT Assessment - 03/02/22 1519       Standardized Balance Assessment   Standardized Balance Assessment Dynamic Gait Index      Dynamic Gait Index   Level Surface Mild Impairment    Change in Gait Speed Mild Impairment    Gait with Horizontal Head Turns Moderate Impairment    Gait with Vertical Head Turns Mild Impairment    Gait and Pivot Turn Normal    Step Over Obstacle Mild Impairment    Step Around Obstacles Mild Impairment    Steps Moderate Impairment    Total Score 15    DGI comment: Scores <19/24 indicate increased fall risk              TODAY'S TREATMENT:  DATE: 03/02/2022    PATIENT EDUCATION: Education details: Eval results, POC Person educated: Patient Education method: Explanation Education  comprehension: verbalized understanding  HOME EXERCISE PROGRAM: Not yet initiated  GOALS: Goals reviewed with patient? Yes  SHORT TERM GOALS: Target date: 03/26/2022  Pt will be independent with HEP for improved strength, flexibility, balance, gait. Baseline: Goal status: INITIAL  LONG TERM GOALS: Target date: 04/13/2022  Pt will be independent with progression of HEP for improved strength, balance, gait.  Baseline:  Goal status: INITIAL  2.  Pt will improve DGI score to at least 19/24 to decrease fall risk. Baseline: 15/24 Goal status: INITIAL  3.  Pt will improve gait velocity to at least 3.3 ft/sec for improved gait efficiency and safety.  Baseline: 2.9 ft/sec Goal status: INITIAL  ASSESSMENT:  CLINICAL IMPRESSION: Patient is a 57 y.o. male who was seen today for physical therapy evaluation and treatment for gait instability secondary to myalgia nad sensory ataxia.  Pt has been seeing neurologist at [facility] since 03/2021 for myalgia and numbness, sensation changes in hands/feet.  He notes different walking pattern, some tightness or pulling sensations in his L quads and feeling overall that L side is weaker, causing R side to compensate with gait.  In evaluation today, pt presents with decreased strength (LLE deficits more than RLE), abnormal tone, decreased balance, decreased timing and coordination of gait.  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  He will benefit from skilled PT towards goals for improved functional mobility and decreased fall risk.  OBJECTIVE IMPAIRMENTS: Abnormal gait, decreased balance, decreased mobility, difficulty walking, decreased strength, and impaired flexibility.   ACTIVITY LIMITATIONS: standing, squatting, and locomotion level  PARTICIPATION LIMITATIONS: community activity and fitness  PERSONAL FACTORS: 3+ comorbidities: see above  are also affecting patient's functional outcome.   REHAB  POTENTIAL: Good  CLINICAL DECISION MAKING: Evolving/moderate complexity  EVALUATION COMPLEXITY: Moderate  PLAN:  PT FREQUENCY: 2x/week  PT DURATION: 6 weeks (including PT eval week)  PLANNED INTERVENTIONS: Therapeutic exercises, Therapeutic activity, Neuromuscular re-education, Balance training, Gait training, Patient/Family education, Self Care, and Manual therapy  PLAN FOR NEXT SESSION: Check flexibility gastrocs and hip flexors.  Initiate HEP for BLE strengthening.  Work on step length, gait pattern   Juanangel Soderholm W., PT 03/02/2022, 3:22 PM

## 2022-03-04 ENCOUNTER — Encounter: Payer: Self-pay | Admitting: Physical Therapy

## 2022-03-04 ENCOUNTER — Ambulatory Visit: Payer: BC Managed Care – PPO | Admitting: Physical Therapy

## 2022-03-04 DIAGNOSIS — R2689 Other abnormalities of gait and mobility: Secondary | ICD-10-CM

## 2022-03-04 DIAGNOSIS — M6281 Muscle weakness (generalized): Secondary | ICD-10-CM

## 2022-03-04 DIAGNOSIS — R2681 Unsteadiness on feet: Secondary | ICD-10-CM

## 2022-03-04 NOTE — Therapy (Signed)
OUTPATIENT PHYSICAL THERAPY NEURO EVALUATION   Patient Name: Hector Short MRN: [id number] DOB:[birth year], 57 y.o., male Today's Date: 03/05/2022   PCP: Lavone Orn, MD  REFERRING PROVIDER: Concepcion Living, MD   END OF SESSION:  PT End of Session - 03/04/22 1535     Visit Number 2    Number of Visits 12    Date for PT Re-Evaluation 04/09/22    Authorization Type BCBS    PT Start Time [id number]    PT Stop Time 1618    PT Time Calculation (min) 43 min    Activity Tolerance Patient tolerated treatment well    Behavior During Therapy [facility] for tasks assessed/performed             History reviewed. No pertinent past medical history. History reviewed. No pertinent surgical history. Patient Active Problem List   Diagnosis Date Noted   Allergic rhinitis due to pollen 01/22/2021   Attention and concentration deficit 01/22/2021   Benign neoplasm of skin of lower limb 01/22/2021   Chronic fatigue syndrome 01/22/2021   Lactose intolerance 01/22/2021   Loss of appetite 01/22/2021   Melanocytic nevi of trunk 01/22/2021   Neoplasm of uncertain behavior of skin 01/22/2021   Sebaceous hyperplasia 01/22/2021   Skin sensation disturbance 01/22/2021   Telangiectasia 01/22/2021   Paresthesia 01/22/2021    ONSET DATE: 12/30/2021 MD referral  REFERRING DIAG: M79.10 (ICD-10-CM) - Myalgia, unspecified site  THERAPY DIAG:  Muscle weakness (generalized)  Unsteadiness on feet  Other abnormalities of gait and mobility  Rationale for Evaluation and Treatment: Rehabilitation  SUBJECTIVE:                                                                                                                                                                                             SUBJECTIVE STATEMENT: My legs still feel weird.  To go tomorrow for the nerve conduction study.     Pt accompanied by: self  PERTINENT HISTORY: Per MD note 12/23:  gait instability secondary to  sensory ataxia and myalgia.  Per MD note:  family history of Charcot Marie-Tooth disease and polio; per pt report, family hx of ALS  PAIN:  Are you having pain? Yes: NPRS scale: 3-4/10 Pain location: tops of feet, radiates up my foot Pain description: burning Aggravating factors: walking Relieving factors: rest, taking pressure off feet  PRECAUTIONS: Fall  WEIGHT BEARING RESTRICTIONS: No  FALLS: Has patient fallen in last 6 months? No  LIVING ENVIRONMENT: Lives with: lives with their spouse Lives in: House/apartment Stairs: Yes: External: 2 steps; none Has following equipment at home: None  PLOF: Independent  and Leisure: does yoga and works out mostly daily [employer]; is a Physiological scientist at [employer].  PATIENT GOALS: Pt's goals for therapy are to correct walking pattern to avoid compensations.  To improve flexibility.  OBJECTIVE:    TODAY'S TREATMENT: 03/04/2022 Activity Comments  Gastroc flexibility/R ankle dorsiflexion:  20 degrees R, 20+ degrees    Bridging x 10 reps L foot slides out of position; cues to place feet out slightly, so that knees are not at bent; when he does this, he has L hamstring cramp  Bridging + clamshell 2 x 5 reps   Attempted supine hamstring stretch LLE Cues for technique from 90/90 position; pt experiences tremulous movement in LLE with excess stretch  Hooklying clamshell, single leg with green band x 10 Cues to RELAX leg between reps  Sidelying L clamshell with resistance, x 5   Sideyling hip abduction R and L 10 reps Cues for technique  Seated hamstring stretch, 3 x 30 seconds each leg    Access Code: [email or web address] URL: [email or web address] Date: 03/04/2022 Prepared by: [facility]  Exercises - Sidelying Hip Abduction  - 1 x daily - 5 x weekly - 2 sets - 10 reps - Supine Bridge  - 1 x daily - 5 x weekly - 2 sets - 10 reps - Bridge with Hip ABduction  - 1 x daily - 5 x weekly - 2 sets - 5 reps -  Seated Hamstring stretch  - 2 x daily - 7 x weekly - 1 sets - 3 reps - 30 sec hold  PATIENT EDUCATION: Education details: HEP, cues to relax LLE muscles at hip and briefly rest between reps of ex Person educated: Patient Education method: Explanation, Demonstration, Verbal cues, and Handouts Education comprehension: verbalized understanding, returned demonstration, and needs further education  -------------------------------------------------------------------- Objective measures below taken at initial evaluation:  DIAGNOSTIC FINDINGS: 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  Leg length measured below, from greater trochanter to medial malleolus 32 LLE 31.5 in RLE  LOWER EXTREMITY ROM:   A/ROM WFL in sitting; tends to have increased LLE internal rotation of foot in sitting.  LOWER EXTREMITY MMT:  MMT Right Eval Left Eval  Hip flexion 4+ 4  Hip extension    Hip abduction 4 4  Hip adduction    Hip internal rotation    Hip external rotation    Knee flexion 4+ 4+  Knee extension 4 4  Ankle dorsiflexion 4+ 4  Ankle plantarflexion 4 (18) 3   (4)  Ankle inversion 4 3+  Ankle eversion 4 3+   (Blank rows = not tested)     BED MOBILITY:  Independent  TRANSFERS: Assistive device utilized: None  Sit to stand: Modified independence; not full knee extension with 5x sit<>stand Stand to sit: Modified independence  STAIRS: Level of Assistance: Modified independence Stair Negotiation Technique: Step to Pattern Alternating Pattern  with Single Rail on Right Number of Stairs: 2-3  Height of Stairs:  4-6"  Comments: prefers step-to pattern  GAIT: Gait pattern:  toeing in L>R, decreased push-off BLEs, step through pattern, decreased step length- Right, decreased  step length- Left, decreased stance time- Left, decreased stride length, knee flexed in stance- Right, knee flexed in stance- Left, antalgic, and narrow BOS Distance walked: 50 ft x 3 reps Assistive device utilized: None Level of assistance: Modified independence  FUNCTIONAL TESTS:  5 times sit to stand: 9.88 sec 10 meter walk test: 11.09 sec = 2.96 ft/sec Dynamic Gait Index: 15/24 (Scores <19/24 indicate increased fall risk      TODAY'S TREATMENT:                                                                                                                              DATE: 03/02/2022    PATIENT EDUCATION: Education details: Eval results, POC Person educated: Patient Education method: Explanation Education comprehension: verbalized understanding  HOME EXERCISE PROGRAM: Not yet initiated  GOALS: Goals reviewed with patient? Yes  SHORT TERM GOALS: Target date: 03/26/2022  Pt will be independent with HEP for improved strength, flexibility, balance, gait. Baseline: Goal status: INITIAL  LONG TERM GOALS: Target date: 04/13/2022  Pt will be independent with progression of HEP for improved strength, balance, gait.  Baseline:  Goal status: INITIAL  2.  Pt will improve DGI score to at least 19/24 to decrease fall risk. Baseline: 15/24 Goal status: INITIAL  3.  Pt will improve gait velocity to at least 3.3 ft/sec for improved gait efficiency and safety.  Baseline: 2.9 ft/sec Goal status: INITIAL  ASSESSMENT:  CLINICAL IMPRESSION: Pt returns today for first visit since PT eval.  Focus of session is to address core stability and hip strength.  Pt demonstrates increased L hamstring tigthness today with cramping noted with bridging (activating hamstrings in less knee flexed position) and with supine hamstring  stretch, increased tremulous movements noted in LLE.  Pt is able to perform seated hamstring stretch with decreased to no tremulous movement noted.  Pt does require cues throughout session to relax muscles through hips between reps of exercises, and L hip flexors tend to be engaged/activated throughout full sets of exercise if not cued to relax.  Will continue to work on exercises as well as gait mechanics.  Pt reports feeling better after session.    OBJECTIVE IMPAIRMENTS: Abnormal gait, decreased balance, decreased mobility, difficulty walking, decreased strength, and impaired flexibility.   ACTIVITY LIMITATIONS: standing, squatting, and locomotion level  PARTICIPATION LIMITATIONS: community activity and fitness  PERSONAL FACTORS: 3+ comorbidities: see above  are also affecting patient's functional outcome.   REHAB POTENTIAL: Good  CLINICAL DECISION MAKING: Evolving/moderate complexity  EVALUATION COMPLEXITY: Moderate  PLAN:  PT FREQUENCY: 2x/week  PT DURATION: 6 weeks (including PT eval week)  PLANNED INTERVENTIONS: Therapeutic exercises, Therapeutic activity, Neuromuscular re-education, Balance training, Gait training, Patient/Family education, Self Care, and Manual therapy  PLAN FOR NEXT SESSION: Review HEP and add for core/BLE strengthening.  Work on step length, gait pattern   Ysela Hettinger W., PT 03/05/2022, 12:46 PM  [facility] at [facility] [place],  [place], [place] [address] Phone # [phone] Fax # [phone]

## 2022-03-09 ENCOUNTER — Ambulatory Visit: Payer: BC Managed Care – PPO

## 2022-03-09 DIAGNOSIS — R2681 Unsteadiness on feet: Secondary | ICD-10-CM

## 2022-03-09 DIAGNOSIS — M6281 Muscle weakness (generalized): Secondary | ICD-10-CM

## 2022-03-09 DIAGNOSIS — R2689 Other abnormalities of gait and mobility: Secondary | ICD-10-CM

## 2022-03-09 NOTE — Therapy (Signed)
OUTPATIENT PHYSICAL THERAPY NEURO TREATMENT   Patient Name: Hector Short MRN: [id number] DOB:[birth year], 57 y.o., male Today's Date: 03/09/2022   PCP: Lavone Orn, MD  REFERRING PROVIDER: Concepcion Living, MD   END OF SESSION:  PT End of Session - 03/09/22 0848     Visit Number 3    Number of Visits 12    Date for PT Re-Evaluation 04/09/22    Authorization Type BCBS    PT Start Time 0848    PT Stop Time 0930    PT Time Calculation (min) 42 min    Activity Tolerance Patient tolerated treatment well    Behavior During Therapy [facility] for tasks assessed/performed             No past medical history on file. No past surgical history on file. Patient Active Problem List   Diagnosis Date Noted   Allergic rhinitis due to pollen 01/22/2021   Attention and concentration deficit 01/22/2021   Benign neoplasm of skin of lower limb 01/22/2021   Chronic fatigue syndrome 01/22/2021   Lactose intolerance 01/22/2021   Loss of appetite 01/22/2021   Melanocytic nevi of trunk 01/22/2021   Neoplasm of uncertain behavior of skin 01/22/2021   Sebaceous hyperplasia 01/22/2021   Skin sensation disturbance 01/22/2021   Telangiectasia 01/22/2021   Paresthesia 01/22/2021    ONSET DATE: 12/30/2021 MD referral  REFERRING DIAG: M79.10 (ICD-10-CM) - Myalgia, unspecified site  THERAPY DIAG:  Muscle weakness (generalized)  Unsteadiness on feet  Other abnormalities of gait and mobility  Rationale for Evaluation and Treatment: Rehabilitation  SUBJECTIVE:                                                                                                                                                                                             SUBJECTIVE STATEMENT: Had nerve conduction test but unsure of results  Pt accompanied by: self  PERTINENT HISTORY: Per MD note 12/23:  gait instability secondary to sensory ataxia and myalgia.  Per MD note:  family history of Charcot  Marie-Tooth disease and polio; per pt report, family hx of ALS  PAIN:  Are you having pain? Yes: NPRS scale: 3-4/10 Pain location: tops of feet, radiates up my foot Pain description: burning Aggravating factors: walking Relieving factors: rest, taking pressure off feet  PRECAUTIONS: Fall  WEIGHT BEARING RESTRICTIONS: No  FALLS: Has patient fallen in last 6 months? No  LIVING ENVIRONMENT: Lives with: lives with their spouse Lives in: House/apartment Stairs: Yes: External: 2 steps; none Has following equipment at home: None  PLOF: Independent and Leisure: does yoga and works out mostly daily [employer]  yoga; is a Physiological scientist at [employer].  PATIENT GOALS: Pt's goals for therapy are to correct walking pattern to avoid compensations.  To improve flexibility.  OBJECTIVE:   TODAY'S TREATMENT: 03/09/22 Activity Comments  Thomas test Negative sign  Seated hamstring curls 1x10 W/ cable: 10#, 15#, 25#--notes significant difference btwn left vs right  Positional tests -placed in prone, quick stretch to quad without any increase in tone -seated hip internal rotation appears symmetric -FABER reveals laxity to hip abduction/ER with knee able to touch table  Double/single limb hop -Asymmetric loading and take-off (bias on RLE) -single limb: good take off/landing RLE, unable to achieve flight LLE   Sidelying hip abd 1x10, good form and   Bridge w/ clamshells 3x5 reps w/ green loop  Supine resisted march 1x10 Green loop         TODAY'S TREATMENT: 03/04/2022 Activity Comments  Gastroc flexibility/R ankle dorsiflexion:  20 degrees R, 20+ degrees    Bridging x 10 reps L foot slides out of position; cues to place feet out slightly, so that knees are not at bent; when he does this, he has L hamstring cramp  Bridging + clamshell 2 x 5 reps   Attempted supine hamstring stretch LLE Cues for technique from 90/90 position; pt experiences tremulous movement in LLE with excess stretch  Hooklying  clamshell, single leg with green band x 10 Cues to RELAX leg between reps  Sidelying L clamshell with resistance, x 5   Sideyling hip abduction R and L 10 reps Cues for technique  Seated hamstring stretch, 3 x 30 seconds each leg    Access Code: [email or web address] URL: [email or web address] Date: 03/04/2022 Prepared by: [facility]  Exercises - Sidelying Hip Abduction  - 1 x daily - 5 x weekly - 2 sets - 10 reps - Supine Bridge  - 1 x daily - 5 x weekly - 2 sets - 10 reps - Bridge with Hip ABduction  - 1 x daily - 5 x weekly - 2 sets - 5 reps - Seated Hamstring stretch  - 2 x daily - 7 x weekly - 1 sets - 3 reps - 30 sec hold - Marching with Resistance  - 1 x daily - 7 x weekly - 3 sets - 10 reps   PATIENT EDUCATION: Education details: HEP, cues to relax LLE muscles at hip and briefly rest between reps of ex Person educated: Patient Education method: Explanation, Demonstration, Verbal cues, and Handouts Education comprehension: verbalized understanding, returned demonstration, and needs further education  -------------------------------------------------------------------- Objective measures below taken at initial evaluation:  DIAGNOSTIC FINDINGS: 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  Leg length measured below, from greater trochanter to medial malleolus 32 LLE 31.5 in RLE  LOWER EXTREMITY ROM:   A/ROM WFL in sitting; tends to have increased LLE internal rotation of foot in sitting.  LOWER EXTREMITY MMT:  MMT Right Eval Left Eval  Hip flexion 4+ 4  Hip  extension    Hip abduction 4 4  Hip adduction    Hip internal rotation    Hip external rotation    Knee flexion  4+ 4+  Knee extension 4 4  Ankle dorsiflexion 4+ 4  Ankle plantarflexion 4 (18) 3   (4)  Ankle inversion 4 3+  Ankle eversion 4 3+   (Blank rows = not tested)     BED MOBILITY:  Independent  TRANSFERS: Assistive device utilized: None  Sit to stand: Modified independence; not full knee extension with 5x sit<>stand Stand to sit: Modified independence  STAIRS: Level of Assistance: Modified independence Stair Negotiation Technique: Step to Pattern Alternating Pattern  with Single Rail on Right Number of Stairs: 2-3  Height of Stairs: 4-6"  Comments: prefers step-to pattern  GAIT: Gait pattern:  toeing in L>R, decreased push-off BLEs, step through pattern, decreased step length- Right, decreased step length- Left, decreased stance time- Left, decreased stride length, knee flexed in stance- Right, knee flexed in stance- Left, antalgic, and narrow BOS Distance walked: 50 ft x 3 reps Assistive device utilized: None Level of assistance: Modified independence  FUNCTIONAL TESTS:  5 times sit to stand: 9.88 sec 10 meter walk test: 11.09 sec = 2.96 ft/sec Dynamic Gait Index: 15/24 (Scores <19/24 indicate increased fall risk      TODAY'S TREATMENT:                                                                                                                              DATE: 03/02/2022    PATIENT EDUCATION: Education details: Eval results, POC Person educated: Patient Education method: Explanation Education comprehension: verbalized understanding  HOME EXERCISE PROGRAM: Not yet initiated  GOALS: Goals reviewed with patient? Yes  SHORT TERM GOALS: Target date: 03/26/2022  Pt will be independent with HEP for improved strength, flexibility, balance, gait. Baseline: Goal status: IN PROGRESS  LONG TERM GOALS: Target date: 04/13/2022  Pt will be independent  with progression of HEP for improved strength, balance, gait.  Baseline:  Goal status: IN PROGRESS  2.  Pt will improve DGI score to at least 19/24 to decrease fall risk. Baseline: 15/24 Goal status: IN PROGRESS  3.  Pt will improve gait velocity to at least 3.3 ft/sec for improved gait efficiency and safety.  Baseline: 2.9 ft/sec Goal status: IN PROGRESS  ASSESSMENT:  CLINICAL IMPRESSION: No left quad spasticity detected to velocity-dependent stretch. Hip mobility reveals negative Thomas test, very flexible piriformis stretch position, very mobile FABER position. Limited ability to perform long-sitting hamstring stretch requiring 20 degree knee flexion to accommodate position which pt reports feels like eminating from lumbar. Limited LLE strength and power evident by unilateral hamstring curls and with single leg hopping.   OBJECTIVE IMPAIRMENTS: Abnormal gait, decreased balance, decreased mobility, difficulty walking, decreased strength, and impaired flexibility.   ACTIVITY LIMITATIONS: standing, squatting, and locomotion level  PARTICIPATION LIMITATIONS: community activity and fitness  PERSONAL FACTORS: 3+ comorbidities: see above  are also affecting patient's functional outcome.   REHAB POTENTIAL: Good  CLINICAL DECISION MAKING: Evolving/moderate complexity  EVALUATION COMPLEXITY: Moderate  PLAN:  PT FREQUENCY: 2x/week  PT DURATION: 6 weeks (including PT eval week)  PLANNED INTERVENTIONS: Therapeutic exercises, Therapeutic activity, Neuromuscular re-education, Balance training, Gait training, Patient/Family education, Self Care, and Manual therapy  PLAN FOR NEXT SESSION: Review HEP and add for core/BLE strengthening.  Work on step length, gait pattern   9:53 AM, 03/09/22 M. Sherlyn Lees, PT, DPT Physical Therapist- [place] Office Number: [phone]   [place] at [facility] [address], [place], [place]  [address] Phone # [phone] Fax # [phone]

## 2022-03-11 ENCOUNTER — Encounter: Payer: Self-pay | Admitting: Physical Therapy

## 2022-03-11 ENCOUNTER — Ambulatory Visit: Payer: BC Managed Care – PPO | Admitting: Physical Therapy

## 2022-03-11 DIAGNOSIS — M6281 Muscle weakness (generalized): Secondary | ICD-10-CM | POA: Diagnosis not present

## 2022-03-11 DIAGNOSIS — R2689 Other abnormalities of gait and mobility: Secondary | ICD-10-CM

## 2022-03-11 DIAGNOSIS — R2681 Unsteadiness on feet: Secondary | ICD-10-CM

## 2022-03-11 NOTE — Therapy (Signed)
OUTPATIENT PHYSICAL THERAPY NEURO TREATMENT   Patient Name: Hector Short MRN: [id number] DOB:05/24/1965, 57 y.o., male Today's Date: 03/11/2022   PCP: Lavone Orn, MD  REFERRING PROVIDER: Concepcion Living, MD   END OF SESSION:  PT End of Session - 03/11/22 0850     Visit Number 4    Number of Visits 12    Date for PT Re-Evaluation 04/09/22    Authorization Type BCBS    PT Start Time [phone]    PT Stop Time 0933    PT Time Calculation (min) 41 min    Activity Tolerance Patient tolerated treatment well    Behavior During Therapy [facility] for tasks assessed/performed             History reviewed. No pertinent past medical history. History reviewed. No pertinent surgical history. Patient Active Problem List   Diagnosis Date Noted   Allergic rhinitis due to pollen 01/22/2021   Attention and concentration deficit 01/22/2021   Benign neoplasm of skin of lower limb 01/22/2021   Chronic fatigue syndrome 01/22/2021   Lactose intolerance 01/22/2021   Loss of appetite 01/22/2021   Melanocytic nevi of trunk 01/22/2021   Neoplasm of uncertain behavior of skin 01/22/2021   Sebaceous hyperplasia 01/22/2021   Skin sensation disturbance 01/22/2021   Telangiectasia 01/22/2021   Paresthesia 01/22/2021    ONSET DATE: 12/30/2021 MD referral  REFERRING DIAG: M79.10 (ICD-10-CM) - Myalgia, unspecified site  THERAPY DIAG:  Muscle weakness (generalized)  Other abnormalities of gait and mobility  Unsteadiness on feet  Rationale for Evaluation and Treatment: Rehabilitation  SUBJECTIVE:                                                                                                                                                                                             SUBJECTIVE STATEMENT: Have several things going on.  Think the leg is getting less wobbly.  Have seen the results from the NCV on my chart, but haven't talked to MD about it yet.  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  Per MD note:  family history of Charcot Marie-Tooth disease and polio; per pt report, family hx of ALS  PAIN:  Are you having pain? Yes: NPRS scale: 2-3/10 Pain location: tops of feet, radiates up my foot Pain description: burning Aggravating factors: walking Relieving factors: rest, taking pressure off feet  PRECAUTIONS: Fall  WEIGHT BEARING RESTRICTIONS: No  FALLS: Has patient fallen in last 6 months? No  LIVING ENVIRONMENT: Lives with: lives with their spouse Lives in: House/apartment  Stairs: Yes: External: 2 steps; none Has following equipment at home: None  PLOF: Independent and Leisure: does yoga and works out mostly daily [employer]; is a Physiological scientist at [employer].  PATIENT GOALS: Pt's goals for therapy are to correct walking pattern to avoid compensations.  To improve flexibility.  OBJECTIVE:    TODAY'S TREATMENT: 03/11/2022 Activity Comments  Quadruped abdominal activation: -alt UE lift x 10 -alt hip kicks 2 x 5 -alt UE/leg kicks  Good form, technique  Seated hamstring curls, green theraband, 2 x 10 reps Cue to keep quads/knees neutral position  Sit<>stand with LLE posterior, 2 x 10 reps Cues to relax between reps, for glut activation in standing  Sit<>stand with hip adduction, ball squeeze, 10 reps   Standing march-review of HEP Good understanding of addition to HEP  Forward weightshifts through anterior portion of feet, x 10    Bilat heel raises x 10; alternating heel raises 2 x 10 LE gastroc fatigues  Stagger stance forward/back rocking, 10 reps for ankle plantarflexion, dorsiflexion   Forward/back step and weightshift x 10 reps   Forward gait along counter with cues for equal, even step length, then progress to gait in gym  Slowed pace, narrowed BOS, tactile and verbal cues to relax through shoulders, widen BOS and keep even, equal step length            Access Code: [email or web address] URL: [email or web address] Date: 03/04/2022 Prepared by: [facility]  Exercises - Sidelying Hip Abduction  - 1 x daily - 5 x weekly - 2 sets - 10 reps - Supine Bridge  - 1 x daily - 5 x weekly - 2 sets - 10 reps - Bridge with Hip ABduction  - 1 x daily - 5 x weekly - 2 sets - 5 reps - Seated Hamstring stretch  - 2 x daily - 7 x weekly - 1 sets - 3 reps - 30 sec hold - Marching with Resistance  - 1 x daily - 7 x weekly - 3 sets - 10 reps    -------------------------------------------------------------------- Objective measures below taken at initial evaluation:  DIAGNOSTIC FINDINGS: 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  Leg length measured below, from greater trochanter to medial malleolus 32 LLE 31.5 in RLE  LOWER EXTREMITY ROM:   A/ROM WFL in sitting; tends to have increased LLE internal rotation of foot in sitting.  LOWER EXTREMITY MMT:  MMT Right Eval Left Eval  Hip flexion 4+ 4  Hip extension    Hip abduction 4 4  Hip adduction    Hip internal rotation    Hip external rotation    Knee flexion 4+ 4+  Knee extension 4 4  Ankle dorsiflexion 4+ 4  Ankle plantarflexion 4 (18) 3   (4)  Ankle inversion 4 3+  Ankle eversion 4 3+   (Blank rows = not tested)     BED MOBILITY:  Independent  TRANSFERS: Assistive device utilized: None  Sit to stand: Modified independence; not full knee extension with 5x sit<>stand Stand to sit: Modified independence  STAIRS: Level of Assistance: Modified independence Stair  Negotiation Technique:  Step to Pattern Alternating Pattern  with Single Rail on Right Number of Stairs: 2-3  Height of Stairs: 4-6"  Comments: prefers step-to pattern  GAIT: Gait pattern:  toeing in L>R, decreased push-off BLEs, step through pattern, decreased step length- Right, decreased step length- Left, decreased stance time- Left, decreased stride length, knee flexed in stance- Right, knee flexed in stance- Left, antalgic, and narrow BOS Distance walked: 50 ft x 3 reps Assistive device utilized: None Level of assistance: Modified independence  FUNCTIONAL TESTS:  5 times sit to stand: 9.88 sec 10 meter walk test: 11.09 sec = 2.96 ft/sec Dynamic Gait Index: 15/24 (Scores <19/24 indicate increased fall risk      GOALS: Goals reviewed with patient? Yes  SHORT TERM GOALS: Target date: 03/26/2022  Pt will be independent with HEP for improved strength, flexibility, balance, gait. Baseline: Goal status: IN PROGRESS  LONG TERM GOALS: Target date: 04/13/2022  Pt will be independent with progression of HEP for improved strength, balance, gait.  Baseline:  Goal status: IN PROGRESS  2.  Pt will improve DGI score to at least 19/24 to decrease fall risk. Baseline: 15/24 Goal status: IN PROGRESS  3.  Pt will improve gait velocity to at least 3.3 ft/sec for improved gait efficiency and safety.  Baseline: 2.9 ft/sec Goal status: IN PROGRESS  ASSESSMENT:  CLINICAL IMPRESSION: Skilled PT session today focused on continued strengthening through core as well as isolating hamstrings, hip adductors/abductors, quads and gastrocs on LLE for targeted strengthening.  LLE muscles fatigue throughout exercises, and pt continues to need cues to relax muscles between reps, as he tends to keep LLE musculature, especially quads activated through eccentric and concentric phases.  He will continue to benefit from skilled PT towards goals for improved functional mobility and improved  strength.  OBJECTIVE IMPAIRMENTS: Abnormal gait, decreased balance, decreased mobility, difficulty walking, decreased strength, and impaired flexibility.   ACTIVITY LIMITATIONS: standing, squatting, and locomotion level  PARTICIPATION LIMITATIONS: community activity and fitness  PERSONAL FACTORS: 3+ comorbidities: see above  are also affecting patient's functional outcome.   REHAB POTENTIAL: Good  CLINICAL DECISION MAKING: Evolving/moderate complexity  EVALUATION COMPLEXITY: Moderate  PLAN:  PT FREQUENCY: 2x/week  PT DURATION: 6 weeks (including PT eval week)  PLANNED INTERVENTIONS: Therapeutic exercises, Therapeutic activity, Neuromuscular re-education, Balance training, Gait training, Patient/Family education, Self Care, and Manual therapy  PLAN FOR NEXT SESSION: Continue LLE strengthening and core stability.  Work on step length, gait pattern   Mady Haagensen, PT 03/11/22 10:40 AM Phone: [phone] Fax: [facility] [place], [place], [place] [address] Phone # [phone] Fax # [phone]

## 2022-03-16 ENCOUNTER — Encounter: Payer: Self-pay | Admitting: Physical Therapy

## 2022-03-16 ENCOUNTER — Ambulatory Visit: Payer: BC Managed Care – PPO | Admitting: Physical Therapy

## 2022-03-16 DIAGNOSIS — M6281 Muscle weakness (generalized): Secondary | ICD-10-CM

## 2022-03-16 DIAGNOSIS — R2689 Other abnormalities of gait and mobility: Secondary | ICD-10-CM

## 2022-03-16 DIAGNOSIS — R2681 Unsteadiness on feet: Secondary | ICD-10-CM

## 2022-03-16 NOTE — Therapy (Signed)
OUTPATIENT PHYSICAL THERAPY NEURO TREATMENT   Patient Name: Hector Short MRN: [id number] DOB:October 21, 1965, 57 y.o., male Today's Date: 03/16/2022   PCP: Lavone Orn, MD  REFERRING PROVIDER: Concepcion Living, MD   END OF SESSION:  PT End of Session - 03/16/22 0848     Visit Number 5    Number of Visits 12    Date for PT Re-Evaluation 04/09/22    Authorization Type BCBS    PT Start Time 0848    PT Stop Time 0928    PT Time Calculation (min) 40 min    Activity Tolerance Patient tolerated treatment well    Behavior During Therapy [facility] for tasks assessed/performed             History reviewed. No pertinent past medical history. History reviewed. No pertinent surgical history. Patient Active Problem List   Diagnosis Date Noted   Allergic rhinitis due to pollen 01/22/2021   Attention and concentration deficit 01/22/2021   Benign neoplasm of skin of lower limb 01/22/2021   Chronic fatigue syndrome 01/22/2021   Lactose intolerance 01/22/2021   Loss of appetite 01/22/2021   Melanocytic nevi of trunk 01/22/2021   Neoplasm of uncertain behavior of skin 01/22/2021   Sebaceous hyperplasia 01/22/2021   Skin sensation disturbance 01/22/2021   Telangiectasia 01/22/2021   Paresthesia 01/22/2021    ONSET DATE: 12/30/2021 MD referral  REFERRING DIAG: M79.10 (ICD-10-CM) - Myalgia, unspecified site  THERAPY DIAG:  Other abnormalities of gait and mobility  Unsteadiness on feet  Muscle weakness (generalized)  Rationale for Evaluation and Treatment: Rehabilitation  SUBJECTIVE:                                                                                                                                                                                             SUBJECTIVE STATEMENT: Doing the exercises and feel like my leg feels better.  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   Per MD note:  family history of Charcot Marie-Tooth disease and polio; per pt report, family hx of ALS  PAIN:  Are you having pain? Yes: NPRS scale: 2-3/10 Pain location: tops of feet, radiates up my foot Pain description: burning Aggravating factors: walking Relieving factors: rest, taking pressure off feet  PRECAUTIONS: Fall  WEIGHT BEARING RESTRICTIONS: No  FALLS: Has patient fallen in last 6 months? No  LIVING ENVIRONMENT: Lives with: lives with their spouse Lives in: House/apartment Stairs: Yes: External: 2 steps; none Has following equipment at home: None  PLOF: Independent and Leisure: does yoga and works out  mostly daily Teaches yoga; is a Physiological scientist at [employer].  PATIENT GOALS: Pt's goals for therapy are to correct walking pattern to avoid compensations.  To improve flexibility.  OBJECTIVE:    TODAY'S TREATMENT: 03/16/2022 Activity Comments  Reviewed resisted marching with green band Good form, good technique  Stagger stance forward/back rocking, 10 reps for ankle plantarflexion, dorsiflexion>progress to rock and lift x 10 Light UE support  Lateral weightshift and stretch, then weightshift and lift, 10 reps each> the shift and lift/hamstring curl x 10 Light UE support  Treadmill, BUE support, 3 minutes 0.6>0.8 mph cues for equal, even step length   Over ground gait, 50 ft x 6 reps Cues for "iceskating" to widen feet, as pt starts near-tandem gait pattern, but with good heel/toe  Monster walks at single parallel bars fwd/back x 3, then with theraband, 4 reps Green band   BP at end of exercises:  144/97 No c/o symptoms (just reports that his BP is more elevated than typical, so this is why PT assessed BP today) After several minutes:  139/94  PATIENT EDUCATION: Education details: HEP additions; f/u with MD regarding BP; education in talking with MD about additional symptoms/complaints (rash, recent fever, pain, etc) Person educated: Patient Education method:  Explanation, Demonstration, Verbal cues, and Handouts Education comprehension: verbalized understanding and returned demonstration    Access Code: [facility] URL: [email or web address] Date: 03/16/2022 Prepared by: [facility]  Exercises - Sidelying Hip Abduction  - 1 x daily - 5 x weekly - 2 sets - 10 reps - Supine Bridge  - 1 x daily - 5 x weekly - 2 sets - 10 reps - Bridge with Hip ABduction  - 1 x daily - 5 x weekly - 2 sets - 5 reps - Seated Hamstring stretch  - 2 x daily - 7 x weekly - 1 sets - 3 reps - 30 sec hold - Marching with Resistance  - 1 x daily - 7 x weekly - 3 sets - 10 reps - Forward and Backward Monster Walk with Resistance at Ankles and Counter Support  - 1 x daily - 5 x weekly - 1 sets - 5 reps           -------------------------------------------------------------------- Objective measures below taken at initial evaluation:  DIAGNOSTIC FINDINGS: 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  Leg length measured below, from greater trochanter to medial malleolus 32 LLE 31.5 in RLE  LOWER EXTREMITY ROM:   A/ROM WFL in sitting; tends to have increased LLE internal rotation of foot in sitting.  LOWER EXTREMITY MMT:  MMT Right Eval Left Eval  Hip flexion 4+ 4  Hip extension    Hip abduction 4 4  Hip adduction    Hip internal rotation    Hip external rotation    Knee flexion 4+ 4+  Knee extension 4 4  Ankle dorsiflexion 4+ 4  Ankle plantarflexion 4 (18) 3   (4)  Ankle inversion 4 3+  Ankle eversion 4 3+   (Blank rows = not  tested)     BED MOBILITY:  Independent  TRANSFERS: Assistive device utilized:  None  Sit to stand: Modified independence; not full knee extension with 5x sit<>stand Stand to sit: Modified independence  STAIRS: Level of Assistance: Modified independence Stair Negotiation Technique: Step to Pattern Alternating Pattern  with Single Rail on Right Number of Stairs: 2-3  Height of Stairs: 4-6"  Comments: prefers step-to pattern  GAIT: Gait pattern:  toeing in L>R, decreased push-off BLEs, step through pattern, decreased step length- Right, decreased step length- Left, decreased stance time- Left, decreased stride length, knee flexed in stance- Right, knee flexed in stance- Left, antalgic, and narrow BOS Distance walked: 50 ft x 3 reps Assistive device utilized: None Level of assistance: Modified independence  FUNCTIONAL TESTS:  5 times sit to stand: 9.88 sec 10 meter walk test: 11.09 sec = 2.96 ft/sec Dynamic Gait Index: 15/24 (Scores <19/24 indicate increased fall risk      GOALS: Goals reviewed with patient? Yes  SHORT TERM GOALS: Target date: 03/26/2022  Pt will be independent with HEP for improved strength, flexibility, balance, gait. Baseline: Goal status: IN PROGRESS  LONG TERM GOALS: Target date: 04/13/2022  Pt will be independent with progression of HEP for improved strength, balance, gait.  Baseline:  Goal status: IN PROGRESS  2.  Pt will improve DGI score to at least 19/24 to decrease fall risk. Baseline: 15/24 Goal status: IN PROGRESS  3.  Pt will improve gait velocity to at least 3.3 ft/sec for improved gait efficiency and safety.  Baseline: 2.9 ft/sec Goal status: IN PROGRESS  ASSESSMENT:  CLINICAL IMPRESSION: Skilled PT session today focused on standing exercises for increased LLE weightshifting/weightbearing and gait training for improved LLE stance time/step length.  Pt overall reports he is noticing improvements in gait pattern and activation of LLE,  but with gait, he has narrowed BOS with gait.  He is able to improve slightly with cues.  He reports elevated BP measures at home, and BP is elevated somewhat in PT session today; he has already contacted MD to discuss.  He will continue to benefit from skilled PT towards goals for improved overall functional mobility and gait.  OBJECTIVE IMPAIRMENTS: Abnormal gait, decreased balance, decreased mobility, difficulty walking, decreased strength, and impaired flexibility.   ACTIVITY LIMITATIONS: standing, squatting, and locomotion level  PARTICIPATION LIMITATIONS: community activity and fitness  PERSONAL FACTORS: 3+ comorbidities: see above  are also affecting patient's functional outcome.   REHAB POTENTIAL: Good  CLINICAL DECISION MAKING: Evolving/moderate complexity  EVALUATION COMPLEXITY: Moderate  PLAN:  PT FREQUENCY: 2x/week  PT DURATION: 6 weeks (including PT eval week)  PLANNED INTERVENTIONS: Therapeutic exercises, Therapeutic activity, Neuromuscular re-education, Balance training, Gait training, Patient/Family education, Self Care, and Manual therapy  PLAN FOR NEXT SESSION: Continue LLE strengthening and core stability.  Work on step length, gait pattern; utilize treadmill and backwards gait   Mady Haagensen, PT 03/16/22 9:31 AM Phone: [phone] Fax: [facility] [place], [place], [place] [address] Phone # [phone] Fax # [phone]

## 2022-03-18 ENCOUNTER — Ambulatory Visit: Payer: BC Managed Care – PPO

## 2022-03-19 ENCOUNTER — Ambulatory Visit: Payer: BC Managed Care – PPO | Admitting: Physical Therapy

## 2022-03-30 ENCOUNTER — Ambulatory Visit: Payer: BC Managed Care – PPO | Attending: Neurology

## 2022-03-30 DIAGNOSIS — R2681 Unsteadiness on feet: Secondary | ICD-10-CM | POA: Insufficient documentation

## 2022-03-30 DIAGNOSIS — R2689 Other abnormalities of gait and mobility: Secondary | ICD-10-CM | POA: Diagnosis present

## 2022-03-30 DIAGNOSIS — M6281 Muscle weakness (generalized): Secondary | ICD-10-CM | POA: Insufficient documentation

## 2022-03-30 NOTE — Therapy (Signed)
OUTPATIENT PHYSICAL THERAPY NEURO TREATMENT   Patient Name: Hector Short MRN: [id number] DOB:07-20-1965, 57 y.o., male Today's Date: 03/30/2022   PCP: Lavone Orn, MD  REFERRING PROVIDER: Concepcion Living, MD   END OF SESSION:  PT End of Session - 03/30/22 0848     Visit Number 6    Number of Visits 12    Date for PT Re-Evaluation 04/09/22    Authorization Type BCBS    PT Start Time [phone]    PT Stop Time 0930    PT Time Calculation (min) 43 min    Activity Tolerance Patient tolerated treatment well    Behavior During Therapy [facility] for tasks assessed/performed             No past medical history on file. No past surgical history on file. Patient Active Problem List   Diagnosis Date Noted   Allergic rhinitis due to pollen 01/22/2021   Attention and concentration deficit 01/22/2021   Benign neoplasm of skin of lower limb 01/22/2021   Chronic fatigue syndrome 01/22/2021   Lactose intolerance 01/22/2021   Loss of appetite 01/22/2021   Melanocytic nevi of trunk 01/22/2021   Neoplasm of uncertain behavior of skin 01/22/2021   Sebaceous hyperplasia 01/22/2021   Skin sensation disturbance 01/22/2021   Telangiectasia 01/22/2021   Paresthesia 01/22/2021    ONSET DATE: 12/30/2021 MD referral  REFERRING DIAG: M79.10 (ICD-10-CM) - Myalgia, unspecified site  THERAPY DIAG:  Other abnormalities of gait and mobility  Unsteadiness on feet  Muscle weakness (generalized)  Rationale for Evaluation and Treatment: Rehabilitation  SUBJECTIVE:                                                                                                                                                                                             SUBJECTIVE STATEMENT: Doing the exercises and feel like my leg feels better.  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  Per MD note:  family history of  Charcot Marie-Tooth disease and polio; per pt report, family hx of ALS  PAIN:  Are you having pain? Yes: NPRS scale: 2-3/10 Pain location: tops of feet, radiates up my foot Pain description: burning Aggravating factors: walking Relieving factors: rest, taking pressure off feet  PRECAUTIONS: Fall  WEIGHT BEARING RESTRICTIONS: No  FALLS: Has patient fallen in last 6 months? No  LIVING ENVIRONMENT: Lives with: lives with their spouse Lives in: House/apartment Stairs: Yes: External: 2 steps; none Has following equipment at home: None  PLOF: Independent and Leisure: does yoga and works out mostly  daily Teaches yoga; is a Physiological scientist at [employer].  PATIENT GOALS: Pt's goals for therapy are to correct walking pattern to avoid compensations.  To improve flexibility.  OBJECTIVE:   TODAY'S TREATMENT: 03/30/22 Activity Comments  Treadmill x 2.5 min ea -2.5 mph flat -2.1 mph grade 5 -2.0 mph grade 12 -backwards at 0.8 mph  Resisted walking x 2 min -20# -30#  Simulated paddle board strokes 3x10 staggered stance on foam/dynadisc -10#  Vitals 140/95 mmHg, 88 bpm   Jump squats 3x5 reps Modified with elevated EOM cues for eccentric control and adequate rest periods between sets        TODAY'S TREATMENT: 03/16/2022 Activity Comments  Reviewed resisted marching with green band Good form, good technique  Stagger stance forward/back rocking, 10 reps for ankle plantarflexion, dorsiflexion>progress to rock and lift x 10 Light UE support  Lateral weightshift and stretch, then weightshift and lift, 10 reps each> the shift and lift/hamstring curl x 10 Light UE support  Treadmill, BUE support, 3 minutes 0.6>0.8 mph cues for equal, even step length   Over ground gait, 50 ft x 6 reps Cues for "iceskating" to widen feet, as pt starts near-tandem gait pattern, but with good heel/toe  Monster walks at single parallel bars fwd/back x 3, then with theraband, 4 reps Green band   BP at end of exercises:   144/97 No c/o symptoms (just reports that his BP is more elevated than typical, so this is why PT assessed BP today) After several minutes:  139/94  PATIENT EDUCATION: Education details: HEP additions; f/u with MD regarding BP; education in talking with MD about additional symptoms/complaints (rash, recent fever, pain, etc) Person educated: Patient Education method: Explanation, Demonstration, Verbal cues, and Handouts Education comprehension: verbalized understanding and returned demonstration    Access Code: [facility] URL: [email or web address] Date: 03/16/2022 Prepared by: [facility]  Exercises - Sidelying Hip Abduction  - 1 x daily - 5 x weekly - 2 sets - 10 reps - Supine Bridge  - 1 x daily - 5 x weekly - 2 sets - 10 reps - Bridge with Hip ABduction  - 1 x daily - 5 x weekly - 2 sets - 5 reps - Seated Hamstring stretch  - 2 x daily - 7 x weekly - 1 sets - 3 reps - 30 sec hold - Marching with Resistance  - 1 x daily - 7 x weekly - 3 sets - 10 reps - Forward and Backward Monster Walk with Resistance at Ankles and Counter Support  - 1 x daily - 5 x weekly - 1 sets - 5 reps           -------------------------------------------------------------------- Objective measures below taken at initial evaluation:  DIAGNOSTIC FINDINGS: 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  Leg length measured below, from greater trochanter to medial malleolus 32 LLE 31.5 in RLE  LOWER EXTREMITY ROM:   A/ROM WFL in sitting; tends to have increased LLE internal rotation of foot  in sitting.  LOWER EXTREMITY MMT:  MMT Right Eval Left Eval  Hip flexion 4+ 4  Hip extension  Hip abduction 4 4  Hip adduction    Hip internal rotation    Hip external rotation    Knee flexion 4+ 4+  Knee extension 4 4  Ankle dorsiflexion 4+ 4  Ankle plantarflexion 4 (18) 3   (4)  Ankle inversion 4 3+  Ankle eversion 4 3+   (Blank rows = not tested)     BED MOBILITY:  Independent  TRANSFERS: Assistive device utilized: None  Sit to stand: Modified independence; not full knee extension with 5x sit<>stand Stand to sit: Modified independence  STAIRS: Level of Assistance: Modified independence Stair Negotiation Technique: Step to Pattern Alternating Pattern  with Single Rail on Right Number of Stairs: 2-3  Height of Stairs: 4-6"  Comments: prefers step-to pattern  GAIT: Gait pattern:  toeing in L>R, decreased push-off BLEs, step through pattern, decreased step length- Right, decreased step length- Left, decreased stance time- Left, decreased stride length, knee flexed in stance- Right, knee flexed in stance- Left, antalgic, and narrow BOS Distance walked: 50 ft x 3 reps Assistive device utilized: None Level of assistance: Modified independence  FUNCTIONAL TESTS:  5 times sit to stand: 9.88 sec 10 meter walk test: 11.09 sec = 2.96 ft/sec Dynamic Gait Index: 15/24 (Scores <19/24 indicate increased fall risk      GOALS: Goals reviewed with patient? Yes  SHORT TERM GOALS: Target date: 03/26/2022  Pt will be independent with HEP for improved strength, flexibility, balance, gait. Baseline: Goal status: IN PROGRESS  LONG TERM GOALS: Target date: 04/13/2022  Pt will be independent with progression of HEP for improved strength, balance, gait.  Baseline:  Goal status: IN PROGRESS  2.  Pt will improve DGI score to at least 19/24 to decrease fall risk. Baseline: 15/24 Goal status: IN PROGRESS  3.  Pt will improve gait velocity to at least 3.3 ft/sec for improved  gait efficiency and safety.  Baseline: 2.9 ft/sec Goal status: IN PROGRESS  ASSESSMENT:  CLINICAL IMPRESSION: Continued with activities to improve recruitment and stability with emphasis on improving gait kinematics and then inducing LE power with training in adequate pace and work:rest ratio. Difficulty with eccentric control in lowering after explosive concentric phase "jump". Continued sessions to advance POC details  OBJECTIVE IMPAIRMENTS: Abnormal gait, decreased balance, decreased mobility, difficulty walking, decreased strength, and impaired flexibility.   ACTIVITY LIMITATIONS: standing, squatting, and locomotion level  PARTICIPATION LIMITATIONS: community activity and fitness  PERSONAL FACTORS: 3+ comorbidities: see above  are also affecting patient's functional outcome.   REHAB POTENTIAL: Good  CLINICAL DECISION MAKING: Evolving/moderate complexity  EVALUATION COMPLEXITY: Moderate  PLAN:  PT FREQUENCY: 2x/week  PT DURATION: 6 weeks (including PT eval week)  PLANNED INTERVENTIONS: Therapeutic exercises, Therapeutic activity, Neuromuscular re-education, Balance training, Gait training, Patient/Family education, Self Care, and Manual therapy  PLAN FOR NEXT SESSION: Continue LLE strengthening and core stability.  Work on step length, gait pattern; utilize treadmill and backwards gait    9:29 AM, 03/30/22 M. Sherlyn Lees, PT, DPT Physical Therapist- [place] Office Number: [phone]

## 2022-04-01 ENCOUNTER — Ambulatory Visit: Payer: BC Managed Care – PPO

## 2022-04-01 DIAGNOSIS — M6281 Muscle weakness (generalized): Secondary | ICD-10-CM

## 2022-04-01 DIAGNOSIS — R2689 Other abnormalities of gait and mobility: Secondary | ICD-10-CM | POA: Diagnosis not present

## 2022-04-01 DIAGNOSIS — R2681 Unsteadiness on feet: Secondary | ICD-10-CM

## 2022-04-01 NOTE — Therapy (Signed)
OUTPATIENT PHYSICAL THERAPY NEURO TREATMENT   Patient Name: Hector Short MRN: [id number] DOB:December 09, 1965, 57 y.o., male Today's Date: 04/01/2022   PCP: Lavone Orn, MD  REFERRING PROVIDER: Concepcion Living, MD   END OF SESSION:  PT End of Session - 04/01/22 0846     Visit Number 7    Number of Visits 12    Date for PT Re-Evaluation 04/09/22    Authorization Type BCBS    PT Start Time 0845    PT Stop Time 0930    PT Time Calculation (min) 45 min    Activity Tolerance Patient tolerated treatment well    Behavior During Therapy [facility] for tasks assessed/performed             No past medical history on file. No past surgical history on file. Patient Active Problem List   Diagnosis Date Noted   Allergic rhinitis due to pollen 01/22/2021   Attention and concentration deficit 01/22/2021   Benign neoplasm of skin of lower limb 01/22/2021   Chronic fatigue syndrome 01/22/2021   Lactose intolerance 01/22/2021   Loss of appetite 01/22/2021   Melanocytic nevi of trunk 01/22/2021   Neoplasm of uncertain behavior of skin 01/22/2021   Sebaceous hyperplasia 01/22/2021   Skin sensation disturbance 01/22/2021   Telangiectasia 01/22/2021   Paresthesia 01/22/2021    ONSET DATE: 12/30/2021 MD referral  REFERRING DIAG: M79.10 (ICD-10-CM) - Myalgia, unspecified site  THERAPY DIAG:  Other abnormalities of gait and mobility  Unsteadiness on feet  Muscle weakness (generalized)  Rationale for Evaluation and Treatment: Rehabilitation  SUBJECTIVE:                                                                                                                                                                                             SUBJECTIVE STATEMENT: No new issues  Pt accompanied by: self  PERTINENT HISTORY: Per MD note 12/23:  gait instability secondary to sensory ataxia and myalgia.  Per MD note:  family history of Charcot Marie-Tooth disease and polio; per  pt report, family hx of ALS  PAIN:  Are you having pain? Yes: NPRS scale: 2-3/10 Pain location: tops of feet, radiates up my foot Pain description: burning Aggravating factors: walking Relieving factors: rest, taking pressure off feet  PRECAUTIONS: Fall  WEIGHT BEARING RESTRICTIONS: No  FALLS: Has patient fallen in last 6 months? No  LIVING ENVIRONMENT: Lives with: lives with their spouse Lives in: House/apartment Stairs: Yes: External: 2 steps; none Has following equipment at home: None  PLOF: Independent and Leisure: does yoga and works out mostly daily [employer]; is a Physiological scientist at  [employer].  PATIENT GOALS: Pt's goals for therapy are to correct walking pattern to avoid compensations.  To improve flexibility.  OBJECTIVE:    TODAY'S TREATMENT: 04/01/22 Activity Comments  Single leg STS from elevated EOM 1x10 -typical -PNF chop  Double limb STS hop 1x10 From elevated EOM  Box jump 1x10 on folded gym mat -double limb -single limb (UE support w/ LLE)  Bosu trainer balance -upright -squat hold--incr shaking/instability  kettlebell -leg drive with clean and press -           PATIENT EDUCATION: Education details: HEP additions; f/u with MD regarding BP; education in talking with MD about additional symptoms/complaints (rash, recent fever, pain, etc) Person educated: Patient Education method: Explanation, Demonstration, Verbal cues, and Handouts Education comprehension: verbalized understanding and returned demonstration    Access Code: [facility] URL: [email or web address] Date: 03/16/2022 Prepared by: [facility]  Exercises - Sidelying Hip Abduction  - 1 x daily - 5 x weekly - 2 sets - 10 reps - Supine Bridge  - 1 x daily - 5 x weekly - 2 sets - 10 reps - Bridge with Hip ABduction  - 1 x daily - 5 x weekly - 2 sets - 5 reps - Seated Hamstring stretch  - 2 x daily - 7 x weekly - 1 sets - 3 reps - 30 sec hold -  Marching with Resistance  - 1 x daily - 7 x weekly - 3 sets - 10 reps - Forward and Backward Monster Walk with Resistance at Ankles and Counter Support  - 1 x daily - 5 x weekly - 1 sets - 5 reps           -------------------------------------------------------------------- Objective measures below taken at initial evaluation:  DIAGNOSTIC FINDINGS: 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  Leg length measured below, from greater trochanter to medial malleolus 32 LLE 31.5 in RLE  LOWER EXTREMITY ROM:   A/ROM WFL in sitting; tends to have increased LLE internal rotation of foot in sitting.  LOWER EXTREMITY MMT:  MMT Right Eval Left Eval  Hip flexion 4+ 4  Hip extension    Hip abduction 4 4  Hip adduction    Hip internal rotation    Hip external rotation    Knee flexion 4+ 4+  Knee extension 4 4  Ankle dorsiflexion 4+ 4  Ankle plantarflexion 4 (18) 3   (4)  Ankle inversion 4 3+  Ankle eversion 4 3+   (Blank rows = not tested)     BED MOBILITY:  Independent  TRANSFERS: Assistive device utilized: None  Sit to stand: Modified independence; not full knee extension with 5x sit<>stand Stand to sit: Modified independence  STAIRS: Level of Assistance: Modified independence Stair Negotiation Technique: Step to Pattern Alternating Pattern  with Single Rail on Right Number of Stairs: 2-3  Height of Stairs: 4-6"  Comments: prefers step-to pattern  GAIT: Gait pattern:  toeing in L>R, decreased push-off BLEs, step through pattern, decreased step length- Right, decreased step length- Left, decreased  stance time- Left, decreased stride length, knee flexed in stance- Right, knee flexed in  stance- Left, antalgic, and narrow BOS Distance walked: 50 ft x 3 reps Assistive device utilized: None Level of assistance: Modified independence  FUNCTIONAL TESTS:  5 times sit to stand: 9.88 sec 10 meter walk test: 11.09 sec = 2.96 ft/sec Dynamic Gait Index: 15/24 (Scores <19/24 indicate increased fall risk      GOALS: Goals reviewed with patient? Yes  SHORT TERM GOALS: Target date: 03/26/2022  Pt will be independent with HEP for improved strength, flexibility, balance, gait. Baseline: Goal status: IN PROGRESS  LONG TERM GOALS: Target date: 04/13/2022  Pt will be independent with progression of HEP for improved strength, balance, gait.  Baseline:  Goal status: IN PROGRESS  2.  Pt will improve DGI score to at least 19/24 to decrease fall risk. Baseline: 15/24 Goal status: IN PROGRESS  3.  Pt will improve gait velocity to at least 3.3 ft/sec for improved gait efficiency and safety.  Baseline: 2.9 ft/sec Goal status: IN PROGRESS  ASSESSMENT:  CLINICAL IMPRESSION: Continued with activities to improve recruitment and stability with emphasis on improving gait kinematics and then inducing LE power with training in adequate pace and work:rest ratio. Difficulty with eccentric control in lowering after explosive concentric phase "jump". Continued sessions to advance POC details  OBJECTIVE IMPAIRMENTS: Abnormal gait, decreased balance, decreased mobility, difficulty walking, decreased strength, and impaired flexibility.   ACTIVITY LIMITATIONS: standing, squatting, and locomotion level  PARTICIPATION LIMITATIONS: community activity and fitness  PERSONAL FACTORS: 3+ comorbidities: see above  are also affecting patient's functional outcome.   REHAB POTENTIAL: Good  CLINICAL DECISION MAKING: Evolving/moderate complexity  EVALUATION COMPLEXITY: Moderate  PLAN:  PT FREQUENCY: 2x/week  PT  DURATION: 6 weeks (including PT eval week)  PLANNED INTERVENTIONS: Therapeutic exercises, Therapeutic activity, Neuromuscular re-education, Balance training, Gait training, Patient/Family education, Self Care, and Manual therapy  PLAN FOR NEXT SESSION: Continue LLE strengthening and core stability.  Work on step length, gait pattern; utilize treadmill and backwards gait    8:47 AM, 04/01/22 M. Sherlyn Lees, PT, DPT Physical Therapist- [place] Office Number: [phone]

## 2022-04-06 ENCOUNTER — Ambulatory Visit: Payer: BC Managed Care – PPO

## 2022-04-06 DIAGNOSIS — R2681 Unsteadiness on feet: Secondary | ICD-10-CM

## 2022-04-06 DIAGNOSIS — R2689 Other abnormalities of gait and mobility: Secondary | ICD-10-CM

## 2022-04-06 DIAGNOSIS — M6281 Muscle weakness (generalized): Secondary | ICD-10-CM

## 2022-04-06 NOTE — Therapy (Signed)
OUTPATIENT PHYSICAL THERAPY NEURO TREATMENT   Patient Name: Hector Short MRN: [id number] DOB:[birth year], 57 y.o., male Today's Date: 04/06/2022   PCP: Lavone Orn, MD  REFERRING PROVIDER: Concepcion Living, MD   END OF SESSION:  PT End of Session - 04/06/22 0850     Visit Number 8    Number of Visits 12    Date for PT Re-Evaluation 04/09/22    Authorization Type BCBS    PT Start Time [phone]    PT Stop Time 0930    PT Time Calculation (min) 41 min    Activity Tolerance Patient tolerated treatment well    Behavior During Therapy [facility] for tasks assessed/performed             No past medical history on file. No past surgical history on file. Patient Active Problem List   Diagnosis Date Noted   Allergic rhinitis due to pollen 01/22/2021   Attention and concentration deficit 01/22/2021   Benign neoplasm of skin of lower limb 01/22/2021   Chronic fatigue syndrome 01/22/2021   Lactose intolerance 01/22/2021   Loss of appetite 01/22/2021   Melanocytic nevi of trunk 01/22/2021   Neoplasm of uncertain behavior of skin 01/22/2021   Sebaceous hyperplasia 01/22/2021   Skin sensation disturbance 01/22/2021   Telangiectasia 01/22/2021   Paresthesia 01/22/2021    ONSET DATE: 12/30/2021 MD referral  REFERRING DIAG: M79.10 (ICD-10-CM) - Myalgia, unspecified site  THERAPY DIAG:  Other abnormalities of gait and mobility  Unsteadiness on feet  Muscle weakness (generalized)  Rationale for Evaluation and Treatment: Rehabilitation  SUBJECTIVE:                                                                                                                                                                                             SUBJECTIVE STATEMENT: No new issues  Pt accompanied by: self  PERTINENT HISTORY: Per MD note 12/23:  gait instability secondary to sensory ataxia and myalgia.  Per MD note:  family history of Charcot Marie-Tooth disease and polio; per  pt report, family hx of ALS  PAIN:  Are you having pain? Yes: NPRS scale: 2-3/10 Pain location: tops of feet, radiates up my foot Pain description: burning Aggravating factors: walking Relieving factors: rest, taking pressure off feet  PRECAUTIONS: Fall  WEIGHT BEARING RESTRICTIONS: No  FALLS: Has patient fallen in last 6 months? No  LIVING ENVIRONMENT: Lives with: lives with their spouse Lives in: House/apartment Stairs: Yes: External: 2 steps; none Has following equipment at home: None  PLOF: Independent and Leisure: does yoga and works out mostly daily [employer]; is a Physiological scientist at  [employer].  PATIENT GOALS: Pt's goals for therapy are to correct walking pattern to avoid compensations.  To improve flexibility.  OBJECTIVE:    TODAY'S TREATMENT: 04/06/22 Activity Comments  Lateral skater hops 2x10 60 sec rest--difficulty with LLE landing/loading NMR recruit  Pop-squat jumps w/ 180 turn 2x10 60 sec for NMR recruit  Single leg STS with trunk twist 3x5 5#  Single leg step up w/ 15# unilat hold, 8" box 1x10 regular 1x10 with knee drive  Cable machine deadlift 3x10  40#, 45 sec rest btwn set  Resisted walking 2x2 min 25# for eccentric loading/control    TODAY'S TREATMENT: 04/01/22 Activity Comments  Single leg STS from elevated EOM 1x10 -typical -PNF chop  Double limb STS hop 1x10 From elevated EOM  Box jump 1x10 on folded gym mat -double limb -single limb (UE support w/ LLE)  Bosu trainer balance -upright -squat hold--incr shaking/instability  kettlebell -leg drive with clean and press -           PATIENT EDUCATION: Education details: HEP additions; f/u with MD regarding BP; education in talking with MD about additional symptoms/complaints (rash, recent fever, pain, etc) Person educated: Patient Education method: Explanation, Demonstration, Verbal cues, and Handouts Education comprehension: verbalized understanding and returned demonstration    Access Code:  [facility] URL: [email or web address] Date: 03/16/2022 Prepared by: [facility]  Exercises - Sidelying Hip Abduction  - 1 x daily - 5 x weekly - 2 sets - 10 reps - Supine Bridge  - 1 x daily - 5 x weekly - 2 sets - 10 reps - Bridge with Hip ABduction  - 1 x daily - 5 x weekly - 2 sets - 5 reps - Seated Hamstring stretch  - 2 x daily - 7 x weekly - 1 sets - 3 reps - 30 sec hold - Marching with Resistance  - 1 x daily - 7 x weekly - 3 sets - 10 reps - Forward and Backward Monster Walk with Resistance at Ankles and Counter Support  - 1 x daily - 5 x weekly - 1 sets - 5 reps           -------------------------------------------------------------------- Objective measures below taken at initial evaluation:  DIAGNOSTIC FINDINGS: 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  Leg length measured below, from greater trochanter to medial malleolus 32 LLE 31.5 in RLE  LOWER EXTREMITY ROM:   A/ROM WFL in sitting; tends to have increased LLE internal rotation of foot in sitting.  LOWER EXTREMITY MMT:  MMT Right Eval Left Eval  Hip flexion 4+ 4  Hip extension    Hip abduction 4 4  Hip adduction    Hip internal rotation    Hip external rotation    Knee flexion 4+ 4+  Knee extension 4 4  Ankle dorsiflexion 4+ 4  Ankle plantarflexion 4 (18) 3   (4)  Ankle inversion 4 3+  Ankle eversion 4 3+   (Blank rows = not tested)     BED MOBILITY:  Independent  TRANSFERS: Assistive device utilized: None  Sit to stand: Modified independence; not full knee extension with  5x  sit<>stand Stand to sit: Modified independence  STAIRS: Level of Assistance: Modified independence Stair Negotiation Technique: Step to Pattern Alternating Pattern  with Single Rail on Right Number of Stairs: 2-3  Height of Stairs: 4-6"  Comments: prefers step-to pattern  GAIT: Gait pattern:  toeing in L>R, decreased push-off BLEs, step through pattern, decreased step length- Right, decreased step length- Left, decreased stance time- Left, decreased stride length, knee flexed in stance- Right, knee flexed in stance- Left, antalgic, and narrow BOS Distance walked: 50 ft x 3 reps Assistive device utilized: None Level of assistance: Modified independence  FUNCTIONAL TESTS:  5 times sit to stand: 9.88 sec 10 meter walk test: 11.09 sec = 2.96 ft/sec Dynamic Gait Index: 15/24 (Scores <19/24 indicate increased fall risk      GOALS: Goals reviewed with patient? Yes  SHORT TERM GOALS: Target date: 03/26/2022  Pt will be independent with HEP for improved strength, flexibility, balance, gait. Baseline: Goal status: MET  LONG TERM GOALS: Target date: 04/13/2022  Pt will be independent with progression of HEP for improved strength, balance, gait.  Baseline:  Goal status: IN PROGRESS  2.  Pt will improve DGI score to at least 19/24 to decrease fall risk. Baseline: 15/24 Goal status: IN PROGRESS  3.  Pt will improve gait velocity to at least 3.3 ft/sec for improved gait efficiency and safety.  Baseline: 2.9 ft/sec Goal status: IN PROGRESS  ASSESSMENT:  CLINICAL IMPRESSION: Skilled tx focus on program sequence/development, e.g. power/explosive movements, followed by isolated and "time under tension" strength training for unilateral control followed by bilateral heavy resistance. Education in work:rest ratio based on modality. Improved LLE control following periods of external resistance  OBJECTIVE IMPAIRMENTS: Abnormal gait, decreased balance, decreased mobility, difficulty  walking, decreased strength, and impaired flexibility.   ACTIVITY LIMITATIONS: standing, squatting, and locomotion level  PARTICIPATION LIMITATIONS: community activity and fitness  PERSONAL FACTORS: 3+ comorbidities: see above  are also affecting patient's functional outcome.   REHAB POTENTIAL: Good  CLINICAL DECISION MAKING: Evolving/moderate complexity  EVALUATION COMPLEXITY: Moderate  PLAN:  PT FREQUENCY: 2x/week  PT DURATION: 6 weeks (including PT eval week)  PLANNED INTERVENTIONS: Therapeutic exercises, Therapeutic activity, Neuromuscular re-education, Balance training, Gait training, Patient/Family education, Self Care, and Manual therapy  PLAN FOR NEXT SESSION: Continue LLE strengthening and core stability.  Work on step length, gait pattern; utilize treadmill and backwards gait    8:51 AM, 04/06/22 M. Sherlyn Lees, PT, DPT Physical Therapist- [place] Office Number: [phone]

## 2022-04-08 ENCOUNTER — Ambulatory Visit: Payer: BC Managed Care – PPO

## 2022-04-08 DIAGNOSIS — R2689 Other abnormalities of gait and mobility: Secondary | ICD-10-CM

## 2022-04-08 DIAGNOSIS — R2681 Unsteadiness on feet: Secondary | ICD-10-CM

## 2022-04-08 DIAGNOSIS — M6281 Muscle weakness (generalized): Secondary | ICD-10-CM

## 2022-04-08 NOTE — Therapy (Signed)
OUTPATIENT PHYSICAL THERAPY NEURO TREATMENT and Recertification   Patient Name: Hector Short MRN: [id number] DOB:1965/10/21, 57 y.o., male Today's Date: 04/08/2022   PCP: Lavone Orn, MD  REFERRING PROVIDER: Concepcion Living, MD   END OF SESSION:  PT End of Session - 04/08/22 0848     Visit Number 9    Number of Visits 12    Date for PT Re-Evaluation 04/09/22    Authorization Type BCBS    PT Start Time 0845    PT Stop Time 0930    PT Time Calculation (min) 45 min    Activity Tolerance Patient tolerated treatment well    Behavior During Therapy [facility] for tasks assessed/performed             No past medical history on file. No past surgical history on file. Patient Active Problem List   Diagnosis Date Noted   Allergic rhinitis due to pollen 01/22/2021   Attention and concentration deficit 01/22/2021   Benign neoplasm of skin of lower limb 01/22/2021   Chronic fatigue syndrome 01/22/2021   Lactose intolerance 01/22/2021   Loss of appetite 01/22/2021   Melanocytic nevi of trunk 01/22/2021   Neoplasm of uncertain behavior of skin 01/22/2021   Sebaceous hyperplasia 01/22/2021   Skin sensation disturbance 01/22/2021   Telangiectasia 01/22/2021   Paresthesia 01/22/2021    ONSET DATE: 12/30/2021 MD referral  REFERRING DIAG: M79.10 (ICD-10-CM) - Myalgia, unspecified site  THERAPY DIAG:  Other abnormalities of gait and mobility  Unsteadiness on feet  Muscle weakness (generalized)  Rationale for Evaluation and Treatment: Rehabilitation  SUBJECTIVE:                                                                                                                                                                                             SUBJECTIVE STATEMENT: No new issues  Pt accompanied by: self  PERTINENT HISTORY: Per MD note 12/23:  gait instability secondary to sensory ataxia and myalgia.  Per MD note:  family history of Charcot Marie-Tooth  disease and polio; per pt report, family hx of ALS  PAIN:  Are you having pain? Yes: NPRS scale: 2-3/10 Pain location: tops of feet, radiates up my foot Pain description: burning Aggravating factors: walking Relieving factors: rest, taking pressure off feet  PRECAUTIONS: Fall  WEIGHT BEARING RESTRICTIONS: No  FALLS: Has patient fallen in last 6 months? 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.  PATIENT GOALS: Pt's goals for therapy are to correct walking pattern to avoid compensations.  To improve flexibility.  OBJECTIVE:   TODAY'S TREATMENT: 04/08/22 Activity Comments  NU-step resistance intervals x 8.5 min 30 sec heavy; 1 min light--difficulty with RLE limb posture/control  Hip abd isometric 2x10 With physioball against wall  [place] split squat 2x10   Single leg [place] deadlit 2x10 W/ foam bolster on floating leg for form  palpation Left anterior thigh "knot" with feeling of definite border  Retrowalking x 2 min   DGI 19/24      TODAY'S TREATMENT: 04/06/22 Activity Comments  Lateral skater hops 2x10 60 sec rest--difficulty with LLE landing/loading NMR recruit  Pop-squat jumps w/ 180 turn 2x10 60 sec for NMR recruit  Single leg STS with trunk twist 3x5 5#  Single leg step up w/ 15# unilat hold, 8" box 1x10 regular 1x10 with knee drive  Cable machine deadlift 3x10  40#, 45 sec rest btwn set  Resisted walking 2x2 min 25# for eccentric loading/control    TODAY'S TREATMENT: 04/01/22 Activity Comments  Single leg STS from elevated EOM 1x10 -typical -PNF chop  Double limb STS hop 1x10 From elevated EOM  Box jump 1x10 on folded gym mat -double limb -single limb (UE support w/ LLE)  Bosu trainer balance -upright -squat hold--incr  shaking/instability  kettlebell -leg drive with clean and press -           PATIENT EDUCATION: Education details: HEP additions; f/u with MD regarding BP; education in talking with MD about additional symptoms/complaints (rash, recent fever, pain, etc) Person educated: Patient Education method: Explanation, Demonstration, Verbal cues, and Handouts Education comprehension: verbalized understanding and returned demonstration    Access Code: [facility] URL: [email or web address] Date: 03/16/2022 Prepared by: [facility]  Exercises - Sidelying Hip Abduction  - 1 x daily - 5 x weekly - 2 sets - 10 reps - Supine Bridge  - 1 x daily - 5 x weekly - 2 sets - 10 reps - Bridge with Hip ABduction  - 1 x daily - 5 x weekly - 2 sets - 5 reps - Seated Hamstring stretch  - 2 x daily - 7 x weekly - 1 sets - 3 reps - 30 sec hold - Marching with Resistance  - 1 x daily - 7 x weekly - 3 sets - 10 reps - Forward and Backward Monster Walk with Resistance at Ankles and Counter Support  - 1 x daily - 5 x weekly - 1 sets - 5 reps           -------------------------------------------------------------------- Objective measures below taken at initial evaluation:  DIAGNOSTIC FINDINGS: 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  Leg length measured below, from greater trochanter to medial malleolus 32 LLE 31.5 in RLE  LOWER EXTREMITY ROM:   A/ROM WFL in sitting; tends to have increased LLE internal rotation of foot in sitting.  LOWER EXTREMITY MMT:  MMT Right Eval Left Eval   Hip flexion 4+ 4  Hip extension    Hip abduction 4 4  Hip adduction  Hip internal rotation    Hip external rotation    Knee flexion 4+ 4+  Knee extension 4 4  Ankle dorsiflexion 4+ 4  Ankle plantarflexion 4 (18) 3   (4)  Ankle inversion 4 3+  Ankle eversion 4 3+   (Blank rows = not tested)     BED MOBILITY:  Independent  TRANSFERS: Assistive device utilized: None  Sit to stand: Modified independence; not full knee extension with 5x sit<>stand Stand to sit: Modified independence  STAIRS: Level of Assistance: Modified independence Stair Negotiation Technique: Step to Pattern Alternating Pattern  with Single Rail on Right Number of Stairs: 2-3  Height of Stairs: 4-6"  Comments: prefers step-to pattern  GAIT: Gait pattern:  toeing in L>R, decreased push-off BLEs, step through pattern, decreased step length- Right, decreased step length- Left, decreased stance time- Left, decreased stride length, knee flexed in stance- Right, knee flexed in stance- Left, antalgic, and narrow BOS Distance walked: 50 ft x 3 reps Assistive device utilized: None Level of assistance: Modified independence  FUNCTIONAL TESTS:  5 times sit to stand: 9.88 sec 10 meter walk test: 11.09 sec = 2.96 ft/sec Dynamic Gait Index: 15/24 (Scores <19/24 indicate increased fall risk      GOALS: Goals reviewed with patient? Yes  SHORT TERM GOALS: Target date: 03/26/2022  Pt will be independent with HEP for improved strength, flexibility, balance, gait. Baseline: Goal status: MET  LONG TERM GOALS: Target date: 05/13/2022    Pt will be independent with progression of HEP for improved strength, balance, gait.  Baseline:  Goal status: IN PROGRESS  2.  Pt will improve DGI score to at least 24/24 to decrease fall risk. Baseline: 15/24; (04/08/22) 19/24 Goal status: REVISED  3.  Pt will improve gait velocity to at least 3.3 ft/sec for improved gait efficiency and safety.  Baseline: 2.9 ft/sec Goal  status: IN PROGRESS  ASSESSMENT:  CLINICAL IMPRESSION: Progressing with POC details with recent focus on muscular power, recruitment, and strength to promote proximal stability and improved motor control to normalize gait pattern.  Progressing very well with POC details and tolerated sessions well recently meeting DGI with respective upgrade.  Continues to exhibit gait deviation which improves following exertional efforts. Continued sessions to refine POC details and meet remaining goals.   OBJECTIVE IMPAIRMENTS: Abnormal gait, decreased balance, decreased mobility, difficulty walking, decreased strength, and impaired flexibility.   ACTIVITY LIMITATIONS: standing, squatting, and locomotion level  PARTICIPATION LIMITATIONS: community activity and fitness  PERSONAL FACTORS: 3+ comorbidities: see above  are also affecting patient's functional outcome.   REHAB POTENTIAL: Good  CLINICAL DECISION MAKING: Evolving/moderate complexity  EVALUATION COMPLEXITY: Moderate  PLAN:  PT FREQUENCY: 2x/week  PT DURATION: 6 weeks (including PT eval week)  PLANNED INTERVENTIONS: Therapeutic exercises, Therapeutic activity, Neuromuscular re-education, Balance training, Gait training, Patient/Family education, Self Care, and Manual therapy  PLAN FOR NEXT SESSION: Continue LLE strengthening and core stability.  Work on step length, gait pattern; utilize treadmill and backwards gait    9:31 AM, 04/08/22 M. Sherlyn Lees, PT, DPT Physical Therapist- [place] Office Number: [phone]

## 2022-04-26 ENCOUNTER — Ambulatory Visit: Payer: BC Managed Care – PPO | Attending: Neurology | Admitting: Physical Therapy

## 2022-04-26 ENCOUNTER — Encounter: Payer: Self-pay | Admitting: Physical Therapy

## 2022-04-26 DIAGNOSIS — R2689 Other abnormalities of gait and mobility: Secondary | ICD-10-CM

## 2022-04-26 DIAGNOSIS — M6281 Muscle weakness (generalized): Secondary | ICD-10-CM | POA: Diagnosis present

## 2022-04-26 DIAGNOSIS — R2681 Unsteadiness on feet: Secondary | ICD-10-CM | POA: Diagnosis present

## 2022-04-26 NOTE — Therapy (Signed)
OUTPATIENT PHYSICAL THERAPY NEURO TREATMENT and Recertification   Patient Name: Hector Short MRN: [id number] DOB:14-Dec-1965, 57 y.o., male Today's Date: 04/26/2022   PCP: Kirby Funk, MD  REFERRING PROVIDER: Shirlean Schlein, MD   END OF SESSION:  PT End of Session - 04/26/22 0848     Visit Number 10    Number of Visits 12    Date for PT Re-Evaluation 05/13/22    Authorization Type BCBS    PT Start Time [phone]    PT Stop Time 0930    PT Time Calculation (min) 39 min    Equipment Utilized During Treatment Gait belt    Activity Tolerance Patient tolerated treatment well    Behavior During Therapy [facility] for tasks assessed/performed             History reviewed. No pertinent past medical history. History reviewed. No pertinent surgical history. Patient Active Problem List   Diagnosis Date Noted   Allergic rhinitis due to pollen 01/22/2021   Attention and concentration deficit 01/22/2021   Benign neoplasm of skin of lower limb 01/22/2021   Chronic fatigue syndrome 01/22/2021   Lactose intolerance 01/22/2021   Loss of appetite 01/22/2021   Melanocytic nevi of trunk 01/22/2021   Neoplasm of uncertain behavior of skin 01/22/2021   Sebaceous hyperplasia 01/22/2021   Skin sensation disturbance 01/22/2021   Telangiectasia 01/22/2021   Paresthesia 01/22/2021    ONSET DATE: 12/30/2021 MD referral  REFERRING DIAG: M79.10 (ICD-10-CM) - Myalgia, unspecified site  THERAPY DIAG:  Other abnormalities of gait and mobility  Unsteadiness on feet  Muscle weakness (generalized)  Rationale for Evaluation and Treatment: Rehabilitation  SUBJECTIVE:                                                                                                                                                                                             SUBJECTIVE STATEMENT: Tired, stiff, everything is going better; still have some burning with L quad.  Feel like I'm better with moving  and walking.  Got on the BP meds. 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  Per MD note:  family history of Charcot Marie-Tooth disease and polio; per pt report, family hx of ALS  PAIN:  Are you having pain? Yes: NPRS scale: 1-2/10 Pain location: center of L quad Pain description: burning Aggravating factors: walking Relieving factors: rest, taking pressure off feet  PRECAUTIONS: Fall  WEIGHT BEARING RESTRICTIONS: No  FALLS: Has patient fallen in last 6 months? 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  PATIENT GOALS: Pt's goals for therapy are to correct walking pattern to avoid compensations.  To improve flexibility.  OBJECTIVE:    TODAY'S TREATMENT: 04/26/2022 Activity Comments  Gait in gym x 2 minutes Fatigues at end of gait with slightly antalgic gait pattern  Retro gait x 2 minutes Cues for equal, even step length  Tandem gait forward 4 reps x 20 ft Extra time, pt c/o slight dizziness, but resolves  122/83, HR 74   Tandem gait with step out toe taps Increased time, increased difficulty with R toe taps  Tandem gait with marching  Slowed pace with fatigue in LLE  Wide/narrow lateral jumping jacks, then stagger stance jumping jacks forward/back More difficulty generating power with LLE in posterior position  Gait with no device with head turns, head nods with slowed pace Cues to widen BOS  Trial of walking pole in R hand, 50 ft x 3, supervision and cues Pt reports improved steadiness with gait with trial of walking pole; noted less Trendelenburg      PATIENT EDUCATION: Education details: trial of walking pole-how to obtain Person educated: Patient Education  method: Explanation, Demonstration, Verbal cues, and Handouts Education comprehension: verbalized understanding and returned demonstration    Access Code: [email or web address] URL: [email or web address] Date: 03/16/2022 [facility] by: [facility]patient  Rehab - [facility]  Exercises - Sidelying Hip Abduction  - 1 x daily - 5 x weekly - 2 sets - 10 reps - Supine Bridge  - 1 x daily - 5 x weekly - 2 sets - 10 reps - Bridge with Hip ABduction  - 1 x daily - 5 x weekly - 2 sets - 5 reps - Seated Hamstring stretch  - 2 x daily - 7 x weekly - 1 sets - 3 reps - 30 sec hold - Marching with Resistance  - 1 x daily - 7 x weekly - 3 sets - 10 reps - Forward and Backward Monster Walk with Resistance at Ankles and Counter Support  - 1 x daily - 5 x weekly - 1 sets - 5 reps           -------------------------------------------------------------------- Objective measures below taken at initial evaluation:  DIAGNOSTIC FINDINGS: 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  Leg length measured below, from greater trochanter to medial malleolus 32 LLE 31.5 in RLE  LOWER EXTREMITY ROM:   A/ROM WFL in sitting; tends to have increased LLE internal rotation of foot in sitting.  LOWER EXTREMITY MMT:  MMT Right Eval Left Eval  Hip flexion 4+ 4  Hip extension    Hip abduction 4 4  Hip adduction    Hip internal rotation    Hip external rotation    Knee flexion 4+ 4+  Knee extension 4 4  Ankle dorsiflexion 4+ 4  Ankle plantarflexion 4 (18) 3   (4)  Ankle inversion 4 3+  Ankle eversion 4 3+   (Blank rows = not  tested)     BED MOBILITY:  Independent  TRANSFERS: Assistive device utilized: None  Sit to stand: Modified independence; not full knee extension with 5x sit<>stand Stand to sit: Modified independence  STAIRS: Level of Assistance: Modified independence Stair Negotiation Technique: Step to Pattern Alternating Pattern  with Single Rail on Right Number of Stairs: 2-3  Height of Stairs: 4-6"  Comments: prefers step-to pattern  GAIT: Gait pattern:  toeing in L>R, decreased push-off BLEs, step through pattern, decreased step length- Right, decreased step length- Left, decreased stance time- Left, decreased stride length, knee flexed in stance- Right, knee flexed in stance- Left, antalgic, and narrow BOS Distance walked: 50 ft x 3 reps Assistive device utilized: None Level of assistance: Modified independence  FUNCTIONAL TESTS:  5 times sit to stand: 9.88 sec 10 meter walk test: 11.09 sec = 2.96 ft/sec Dynamic Gait Index: 15/24 (Scores <19/24 indicate increased fall risk      GOALS: Goals reviewed with patient? Yes  SHORT TERM GOALS: Target date: 03/26/2022  Pt will be independent with HEP for improved strength, flexibility, balance, gait. Baseline: Goal status: MET  LONG TERM GOALS: Target date: 05/13/2022    Pt will be independent with progression of HEP for improved strength, balance, gait.  Baseline:  Goal status: IN PROGRESS  2.  Pt will improve DGI score to at least 24/24 to decrease fall risk. Baseline: 15/24; (04/08/22) 19/24 Goal status: REVISED  3.  Pt will improve gait velocity to at least 3.3 ft/sec for improved gait efficiency and safety.  Baseline: 2.9 ft/sec Goal status: IN PROGRESS  ASSESSMENT:  CLINICAL IMPRESSION: Pt has been gone for a couple of weeks, out of town on vacation.  He reports continued stiffness, but overall feels he is stronger and walking is better.  Worked today on dynamic balance, with pt having slowed pace initially, then able to  improve speed and coordination, but with fatigue, pattern slows again.  At end of session, trialed use of single walking pole, with pt demonstrating improved gait pattern with increased step length and decreased pressure/stiffness noted in L quads with use of pole.  He may be interested in use of walking pole for longer distances and educated pt on how to obtain.  He will continue to benefit from skilled PT towards goals for improved functional mobility and decreased fall risk.    OBJECTIVE IMPAIRMENTS: Abnormal gait, decreased balance, decreased mobility, difficulty walking, decreased strength, and impaired flexibility.   ACTIVITY LIMITATIONS: standing, squatting, and locomotion level  PARTICIPATION LIMITATIONS: community activity and fitness  PERSONAL FACTORS: 3+ comorbidities: see above  are also affecting patient's functional outcome.   REHAB POTENTIAL: Good  CLINICAL DECISION MAKING: Evolving/moderate complexity  EVALUATION COMPLEXITY: Moderate  PLAN:  PT FREQUENCY: 2x/week  PT DURATION: 6 weeks (including PT eval week)  PLANNED INTERVENTIONS: Therapeutic exercises, Therapeutic activity, Neuromuscular re-education, Balance training, Gait training, Patient/Family education, Self Care, and Manual therapy  PLAN FOR NEXT SESSION: Trial of walking pole, Continue LLE strengthening and core stability.  Work on step length, gait pattern;  backwards gait.  Update HEP as needed    Lonia Blood, PT 04/26/22 12:02 PM Phone: [phone] Fax: [phone]  [facility] Outpatient Rehab at [facility] [address] [place], [address] [place], [place] [address] Phone # [phone] Fax # [phone]

## 2022-04-28 ENCOUNTER — Ambulatory Visit: Payer: BC Managed Care – PPO

## 2022-05-03 ENCOUNTER — Encounter: Payer: Self-pay | Admitting: Physical Therapy

## 2022-05-03 ENCOUNTER — Ambulatory Visit: Payer: BC Managed Care – PPO | Admitting: Physical Therapy

## 2022-05-03 DIAGNOSIS — R2689 Other abnormalities of gait and mobility: Secondary | ICD-10-CM | POA: Diagnosis not present

## 2022-05-03 DIAGNOSIS — M6281 Muscle weakness (generalized): Secondary | ICD-10-CM

## 2022-05-03 DIAGNOSIS — R2681 Unsteadiness on feet: Secondary | ICD-10-CM

## 2022-05-03 NOTE — Therapy (Signed)
OUTPATIENT PHYSICAL THERAPY NEURO TREATMENT   Patient Name: Hector Short MRN: [id number] DOB:Mar 09, 1965, 57 y.o., male Today's Date: 05/03/2022   PCP: Kirby Funk, MD  REFERRING PROVIDER: Shirlean Schlein, MD   END OF SESSION:  PT End of Session - 05/03/22 0858     Visit Number 11    Number of Visits 12    Date for PT Re-Evaluation 05/13/22    Authorization Type BCBS    PT Start Time [phone]   pt arrives late   PT Stop Time 0926    PT Time Calculation (min) 29 min    Activity Tolerance Patient tolerated treatment well    Behavior During Therapy [facility] for tasks assessed/performed             History reviewed. No pertinent past medical history. History reviewed. No pertinent surgical history. Patient Active Problem List   Diagnosis Date Noted   Allergic rhinitis due to pollen 01/22/2021   Attention and concentration deficit 01/22/2021   Benign neoplasm of skin of lower limb 01/22/2021   Chronic fatigue syndrome 01/22/2021   Lactose intolerance 01/22/2021   Loss of appetite 01/22/2021   Melanocytic nevi of trunk 01/22/2021   Neoplasm of uncertain behavior of skin 01/22/2021   Sebaceous hyperplasia 01/22/2021   Skin sensation disturbance 01/22/2021   Telangiectasia 01/22/2021   Paresthesia 01/22/2021    ONSET DATE: 12/30/2021 MD referral  REFERRING DIAG: M79.10 (ICD-10-CM) - Myalgia, unspecified site  THERAPY DIAG:  Other abnormalities of gait and mobility  Unsteadiness on feet  Muscle weakness (generalized)  Rationale for Evaluation and Treatment: Rehabilitation  SUBJECTIVE:                                                                                                                                                                                             SUBJECTIVE STATEMENT: Got the walking pole and it seems to really be helping my walking.  Have an overall joint stiffness, especially when I first wake up in the morning.  Gets better  with stiffness. 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  Per MD note:  family history of Charcot Marie-Tooth disease and polio; per pt report, family hx of ALS  PAIN:  Are you having pain? Yes: NPRS scale: 1-2/10 Pain location: joints all over Pain description: stiffness Aggravating factors: walking, stretching, movement Relieving factors: rest, taking pressure off feet  PRECAUTIONS: Fall  WEIGHT BEARING RESTRICTIONS: No  FALLS: Has patient fallen in last 6 months? No  LIVING ENVIRONMENT: Lives with: lives with their spouse Lives  in: House/apartment Stairs: Yes: External: 2 steps; none Has following equipment at home: None  PLOF: Independent and Leisure: does yoga and works out mostly daily [employer]; is a Arts administrator at [employer].  PATIENT GOALS: Pt's goals for therapy are to correct walking pattern to avoid compensations.  To improve flexibility.  OBJECTIVE:   Pt brought walking pole into clinic session  TODAY'S TREATMENT: 05/03/2022 Activity Comments  Vitals:  135/96 HR 75   Walking pole education Keeping pole tight, correct height, hand placement and sequence  Gait 3 x 85 ft indoors with single walking pole Good sequence  Outdoor gait with single walking pole 600 ft, supervision Good sequence; with fatigue, decreased stance time L, decreased step length on R            Access Code: [email or web address] URL: [email or web address] Date: 03/16/2022 Prepared by: [facility] - Outpatient  Rehab - [facility]  Exercises - Sidelying Hip Abduction  - 1 x daily - 5 x weekly - 2 sets - 10 reps - Supine Bridge  - 1 x daily - 5 x weekly - 2 sets - 10 reps - Bridge with Hip ABduction  - 1 x daily - 5 x weekly - 2 sets - 5 reps - Seated Hamstring stretch  - 2 x daily - 7 x weekly - 1 sets - 3 reps - 30 sec hold - Marching with Resistance  - 1 x daily - 7 x weekly - 3 sets - 10 reps -  Forward and Backward Monster Walk with Resistance at Ankles and Counter Support  - 1 x daily - 5 x weekly - 1 sets - 5 reps           -------------------------------------------------------------------- Objective measures below taken at initial evaluation:  DIAGNOSTIC FINDINGS: 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  Leg length measured below, from greater trochanter to medial malleolus 32 LLE 31.5 in RLE  LOWER EXTREMITY ROM:   A/ROM WFL in sitting; tends to have increased LLE internal rotation of foot in sitting.  LOWER EXTREMITY MMT:  MMT Right Eval Left Eval  Hip flexion 4+ 4  Hip extension    Hip abduction 4 4  Hip adduction    Hip internal rotation    Hip external rotation    Knee flexion 4+ 4+  Knee extension 4 4  Ankle dorsiflexion 4+ 4  Ankle plantarflexion 4 (18) 3   (4)  Ankle inversion 4 3+  Ankle eversion 4 3+   (Blank rows = not tested)     BED MOBILITY:  Independent  TRANSFERS: Assistive device utilized: None  Sit to stand: Modified independence; not full knee extension with 5x sit<>stand Stand to sit: Modified independence  STAIRS: Level of Assistance: Modified independence Stair Negotiation Technique: Step to Pattern Alternating Pattern  with Single Rail on Right Number of Stairs: 2-3  Height of Stairs: 4-6"  Comments: prefers step-to pattern  GAIT: Gait pattern:  toeing in L>R, decreased push-off BLEs, step through pattern, decreased step length- Right, decreased step length- Left, decreased stance time- Left, decreased stride length, knee flexed in stance- Right,  knee flexed in stance- Left,  antalgic, and narrow BOS Distance walked: 50 ft x 3 reps Assistive device utilized: None Level of assistance: Modified independence  FUNCTIONAL TESTS:  5 times sit to stand: 9.88 sec 10 meter walk test: 11.09 sec = 2.96 ft/sec Dynamic Gait Index: 15/24 (Scores <19/24 indicate increased fall risk      GOALS: Goals reviewed with patient? Yes  SHORT TERM GOALS: Target date: 03/26/2022  Pt will be independent with HEP for improved strength, flexibility, balance, gait. Baseline: Goal status: MET  LONG TERM GOALS: Target date: 05/13/2022    Pt will be independent with progression of HEP for improved strength, balance, gait.  Baseline:  Goal status: IN PROGRESS  2.  Pt will improve DGI score to at least 24/24 to decrease fall risk. Baseline: 15/24; (04/08/22) 19/24 Goal status: REVISED  3.  Pt will improve gait velocity to at least 3.3 ft/sec for improved gait efficiency and safety.  Baseline: 2.9 ft/sec Goal status: IN PROGRESS  ASSESSMENT:  CLINICAL IMPRESSION: Skilled PT session today primarily focused on gait training with single walking pole.  He brings in his new walking pole, and worked on gait sequencing and gait pattern.  Overall, pt uses walking pole well and notes that he feels good muscle use on LLE.  With fatigue, he does revert to decreased L stance time and decreased R step length.  With brief rest break, he is able to reset and is able to achieve more natural reciprocal walking pattern.  He is progressing well towards goals and will likely be ready for discharge next visit.  OBJECTIVE IMPAIRMENTS: Abnormal gait, decreased balance, decreased mobility, difficulty walking, decreased strength, and impaired flexibility.   ACTIVITY LIMITATIONS: standing, squatting, and locomotion level  PARTICIPATION LIMITATIONS: community activity and fitness  PERSONAL FACTORS: 3+ comorbidities: see above  are also affecting patient's functional outcome.    REHAB POTENTIAL: Good  CLINICAL DECISION MAKING: Evolving/moderate complexity  EVALUATION COMPLEXITY: Moderate  PLAN:  PT FREQUENCY: 2x/week  PT DURATION: 6 weeks (including PT eval week)  PLANNED INTERVENTIONS: Therapeutic exercises, Therapeutic activity, Neuromuscular re-education, Balance training, Gait training, Patient/Family education, Self Care, and Manual therapy  PLAN FOR NEXT SESSION: Check LTGs; plan for discharge PT next visit. Update HEP as needed    Lonia Blood, PT 05/03/22 9:34 AM Phone: [phone] Fax: [phone]  [facility] Outpatient Rehab at [facility] [address] [place], [address] [place], [place] [address] Phone # [phone] Fax # [phone]

## 2022-05-10 ENCOUNTER — Ambulatory Visit: Payer: BC Managed Care – PPO

## 2022-05-10 DIAGNOSIS — M6281 Muscle weakness (generalized): Secondary | ICD-10-CM

## 2022-05-10 DIAGNOSIS — R2689 Other abnormalities of gait and mobility: Secondary | ICD-10-CM | POA: Diagnosis not present

## 2022-05-10 DIAGNOSIS — R2681 Unsteadiness on feet: Secondary | ICD-10-CM

## 2022-05-10 NOTE — Therapy (Signed)
OUTPATIENT PHYSICAL THERAPY NEURO TREATMENT and D/C Summary  Patient Name: Hector Short MRN: [id number] DOB:[birth year], 57 y.o., male Today's Date: 05/10/2022   PCP: Kirby Funk, MD  REFERRING PROVIDER: Shirlean Schlein, MD  PHYSICAL THERAPY DISCHARGE SUMMARY  Visits from Start of Care: 12  Current functional level related to goals / functional outcomes: Did not meet outcome measure goals    Remaining deficits: LLE dysfunction, gait dysfunction   Education / Equipment: HEP, use of AD   Patient agrees to discharge. Patient goals were not met. Patient is being discharged due to did not respond to therapy.   END OF SESSION:  PT End of Session - 05/10/22 0929     Visit Number 12    Number of Visits 12    Date for PT Re-Evaluation 05/13/22    Authorization Type BCBS    PT Start Time 0930    PT Stop Time 1015    PT Time Calculation (min) 45 min    Activity Tolerance Patient tolerated treatment well    Behavior During Therapy [facility] for tasks assessed/performed             History reviewed. No pertinent past medical history. History reviewed. No pertinent surgical history. Patient Active Problem List   Diagnosis Date Noted   Allergic rhinitis due to pollen 01/22/2021   Attention and concentration deficit 01/22/2021   Benign neoplasm of skin of lower limb 01/22/2021   Chronic fatigue syndrome 01/22/2021   Lactose intolerance 01/22/2021   Loss of appetite 01/22/2021   Melanocytic nevi of trunk 01/22/2021   Neoplasm of uncertain behavior of skin 01/22/2021   Sebaceous hyperplasia 01/22/2021   Skin sensation disturbance 01/22/2021   Telangiectasia 01/22/2021   Paresthesia 01/22/2021    ONSET DATE: 12/30/2021 MD referral  REFERRING DIAG: M79.10 (ICD-10-CM) - Myalgia, unspecified site  THERAPY DIAG:  Other abnormalities of gait and mobility  Unsteadiness on feet  Muscle weakness (generalized)  Rationale for Evaluation and Treatment:  Rehabilitation  SUBJECTIVE:                                                                                                                                                                                             SUBJECTIVE STATEMENT: Feeling some general achiness and fatigue, unsure of origin.  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  Per MD note:  family history of Charcot Marie-Tooth disease and polio; per pt report, family hx of ALS  PAIN:  Are you having pain? Yes: NPRS scale: 1-2/10 Pain location: joints all over  Pain description: stiffness Aggravating factors: walking, stretching, movement Relieving factors: rest, taking pressure off feet  PRECAUTIONS: Fall  WEIGHT BEARING RESTRICTIONS: No  FALLS: Has patient fallen in last 6 months? No  LIVING ENVIRONMENT: Lives with: lives with their spouse Lives in: House/apartment Stairs: Yes: External: 2 steps; none Has following equipment at home: None  PLOF: Independent and Leisure: does yoga and works out mostly daily [employer]; is a Arts administrator at [employer].  PATIENT GOALS: Pt's goals for therapy are to correct walking pattern to avoid compensations.  To improve flexibility.  OBJECTIVE:   TODAY'S TREATMENT: 05/10/22 Activity Comments  DGI 13/24  Pt education Review of POC details  NU-step resistance intervals x 5.5 1:1   Gait assessment  Tentative with LLE swing (trunk flexion) foot flat loading response          Pt brought walking pole into clinic session  TODAY'S TREATMENT: 05/03/2022 Activity Comments  Vitals:  135/96 HR 75   Walking pole education Keeping pole tight, correct height, hand placement and sequence  Gait 3 x 85 ft indoors with single walking pole Good sequence  Outdoor gait with single walking pole 600 ft, supervision Good sequence; with fatigue, decreased stance time L, decreased step length on R             Access Code: [facility] URL: [email or web address] Date: 03/16/2022 Prepared by: [facility] - Outpatient  Rehab - [facility]  Exercises - Sidelying Hip Abduction  - 1 x daily - 5 x weekly - 2 sets - 10 reps - Supine Bridge  - 1 x daily - 5 x weekly - 2 sets - 10 reps - Bridge with Hip ABduction  - 1 x daily - 5 x weekly - 2 sets - 5 reps - Seated Hamstring stretch  - 2 x daily - 7 x weekly - 1 sets - 3 reps - 30 sec hold - Marching with Resistance  - 1 x daily - 7 x weekly - 3 sets - 10 reps - Forward and Backward Monster Walk with Resistance at Ankles and Counter Support  - 1 x daily - 5 x weekly - 1 sets - 5 reps           -------------------------------------------------------------------- Objective measures below taken at initial evaluation:  DIAGNOSTIC FINDINGS: 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  Leg length measured below, from greater trochanter to medial malleolus 32 LLE 31.5 in RLE  LOWER EXTREMITY ROM:   A/ROM WFL in sitting; tends to have increased LLE internal rotation of foot in sitting.  LOWER EXTREMITY MMT:  MMT Right Eval Left Eval  Hip flexion 4+ 4  Hip extension    Hip abduction 4 4  Hip adduction    Hip internal rotation    Hip external rotation    Knee flexion 4+ 4+  Knee extension 4 4  Ankle dorsiflexion 4+ 4  Ankle plantarflexion 4 (18) 3   (4)  Ankle inversion 4 3+  Ankle eversion 4 3+   (Blank rows = not tested)     BED MOBILITY:  Independent  TRANSFERS: Assistive device utilized: None  Sit to stand:  Modified independence; not full  knee extension with 5x sit<>stand Stand to sit: Modified independence  STAIRS: Level of Assistance: Modified independence Stair Negotiation Technique: Step to Pattern Alternating Pattern  with Single Rail on Right Number of Stairs: 2-3  Height of Stairs: 4-6"  Comments: prefers step-to pattern  GAIT: Gait pattern:  toeing in L>R, decreased push-off BLEs, step through pattern, decreased step length- Right, decreased step length- Left, decreased stance time- Left, decreased stride length, knee flexed in stance- Right, knee flexed in stance- Left, antalgic, and narrow BOS Distance walked: 50 ft x 3 reps Assistive device utilized: None Level of assistance: Modified independence  FUNCTIONAL TESTS:  5 times sit to stand: 9.88 sec 10 meter walk test: 11.09 sec = 2.96 ft/sec Dynamic Gait Index: 15/24 (Scores <19/24 indicate increased fall risk      GOALS: Goals reviewed with patient? Yes  SHORT TERM GOALS: Target date: 03/26/2022  Pt will be independent with HEP for improved strength, flexibility, balance, gait. Baseline: Goal status: MET  LONG TERM GOALS: Target date: 05/13/2022    Pt will be independent with progression of HEP for improved strength, balance, gait.  Baseline:  Goal status: MET  2.  Pt will improve DGI score to at least 24/24 to decrease fall risk. Baseline: 15/24; (04/08/22) 19/24; (05/10/22) 13/24 Goal status: NOT MET  3.  Pt will improve gait velocity to at least 3.3 ft/sec for improved gait efficiency and safety.  Baseline: 2.9 ft/sec; (05/10/22) 3.0 ft/sec Goal status: NOT MET  ASSESSMENT:  CLINICAL IMPRESSION: Patient presents today with increased feeling of fatigue and notes that physical exertion over prlonged periods increase his fatigue and LE symptoms.  Increased gait dysfunction noted today per decreased score DGI.  LLE continues to be affected with palpable muscle mass appreciated anterior left thigh in quadriceps belly.  Pt notes some improvement  vai activity modification and use of walking stick. Left hip flexion strength 4-/5 compared to right 5/5. Pt will D/C to HEP at this time   OBJECTIVE IMPAIRMENTS: Abnormal gait, decreased balance, decreased mobility, difficulty walking, decreased strength, and impaired flexibility.   ACTIVITY LIMITATIONS: standing, squatting, and locomotion level  PARTICIPATION LIMITATIONS: community activity and fitness  PERSONAL FACTORS: 3+ comorbidities: see above  are also affecting patient's functional outcome.   REHAB POTENTIAL: Good  CLINICAL DECISION MAKING: Evolving/moderate complexity  EVALUATION COMPLEXITY: Moderate  PLAN:  PT FREQUENCY: 2x/week  PT DURATION: 6 weeks (including PT eval week)  PLANNED INTERVENTIONS: Therapeutic exercises, Therapeutic activity, Neuromuscular re-education, Balance training, Gait training, Patient/Family education, Self Care, and Manual therapy  PLAN FOR NEXT SESSION: D/C to HEP   10:28 AM, 05/10/22 M. Shary Decamp, PT, DPT Physical Therapist- [place] Office Number: [phone]

## 2022-12-15 ENCOUNTER — Other Ambulatory Visit: Payer: Self-pay | Admitting: Urology

## 2022-12-15 DIAGNOSIS — N402 Nodular prostate without lower urinary tract symptoms: Secondary | ICD-10-CM

## 2022-12-15 DIAGNOSIS — R972 Elevated prostate specific antigen [PSA]: Secondary | ICD-10-CM

## 2023-02-05 ENCOUNTER — Ambulatory Visit
Admission: RE | Admit: 2023-02-05 | Discharge: 2023-02-05 | Disposition: A | Discharge status: Home / Self Care | Payer: 59 | Source: Ambulatory Visit | Attending: Urology | Admitting: Urology

## 2023-02-05 ENCOUNTER — Inpatient Hospital Stay: Admission: RE | Admit: 2023-02-05 | Payer: BC Managed Care – PPO | Source: Ambulatory Visit

## 2023-02-05 DIAGNOSIS — R972 Elevated prostate specific antigen [PSA]: Secondary | ICD-10-CM

## 2023-02-05 DIAGNOSIS — N402 Nodular prostate without lower urinary tract symptoms: Secondary | ICD-10-CM

## 2023-02-05 MED ORDER — GADOPICLENOL 0.5 MMOL/ML IV SOLN
7.5000 mL | Freq: Once | INTRAVENOUS | Status: AC | PRN
  Administered 2023-02-05: 7.5 mL via INTRAVENOUS

## 2023-04-18 ENCOUNTER — Other Ambulatory Visit: Payer: Self-pay | Admitting: Gastroenterology

## 2023-04-18 DIAGNOSIS — R634 Abnormal weight loss: Secondary | ICD-10-CM

## 2023-04-18 DIAGNOSIS — R1084 Generalized abdominal pain: Secondary | ICD-10-CM

## 2023-05-12 ENCOUNTER — Encounter: Payer: Self-pay | Admitting: Gastroenterology

## 2023-05-17 ENCOUNTER — Ambulatory Visit
Admission: RE | Admit: 2023-05-17 | Discharge: 2023-05-17 | Disposition: A | Discharge status: Home / Self Care | Source: Ambulatory Visit | Attending: Gastroenterology | Admitting: Gastroenterology

## 2023-05-17 DIAGNOSIS — R634 Abnormal weight loss: Secondary | ICD-10-CM

## 2023-05-17 DIAGNOSIS — R1084 Generalized abdominal pain: Secondary | ICD-10-CM

## 2023-05-17 MED ORDER — IOPAMIDOL (ISOVUE-300) INJECTION 61%
80.0000 mL | Freq: Once | INTRAVENOUS | Status: AC | PRN
  Administered 2023-05-17: 80 mL via INTRAVENOUS

## 2023-08-12 ENCOUNTER — Other Ambulatory Visit (HOSPITAL_COMMUNITY): Payer: Self-pay | Admitting: Internal Medicine

## 2023-08-12 DIAGNOSIS — E7800 Pure hypercholesterolemia, unspecified: Secondary | ICD-10-CM

## 2023-09-09 ENCOUNTER — Ambulatory Visit (HOSPITAL_COMMUNITY)
Admission: RE | Admit: 2023-09-09 | Discharge: 2023-09-09 | Disposition: A | Discharge status: Home / Self Care | Payer: Self-pay | Source: Ambulatory Visit | Attending: Internal Medicine | Admitting: Internal Medicine

## 2023-09-09 DIAGNOSIS — E7800 Pure hypercholesterolemia, unspecified: Secondary | ICD-10-CM | POA: Insufficient documentation

## 2023-11-25 ENCOUNTER — Ambulatory Visit: Attending: Cardiology | Admitting: Cardiology

## 2023-11-25 ENCOUNTER — Encounter: Payer: Self-pay | Admitting: Cardiology

## 2023-11-25 VITALS — BP 129/77 | HR 68 | Ht 65.0 in | Wt 155.0 lb

## 2023-11-25 DIAGNOSIS — R931 Abnormal findings on diagnostic imaging of heart and coronary circulation: Secondary | ICD-10-CM | POA: Diagnosis not present

## 2023-11-25 DIAGNOSIS — E782 Mixed hyperlipidemia: Secondary | ICD-10-CM | POA: Diagnosis not present

## 2023-11-25 DIAGNOSIS — M2550 Pain in unspecified joint: Secondary | ICD-10-CM

## 2023-11-25 MED ORDER — ROSUVASTATIN CALCIUM 10 MG PO TABS: 10.0000 mg | ORAL_TABLET | Freq: Every day | ORAL | 3 refills | Status: AC

## 2023-11-25 NOTE — Progress Notes (Signed)
 Cardiology Office Note:  .   Date:  11/25/2023  ID:  Hector Short, DOB 1965-08-01, MRN [id number] PCP: Charlott Dorn LABOR, MD  [facility] Providers Cardiologist:  Newman Lawrence, MD PCP: Charlott Dorn LABOR, MD  Chief Complaint  Patient presents with   Elevated CAC     Hector Short is a 58 y.o. male with hypertension, elevated CAC, polyneuropathy, arthralgias  Discussed the use of AI scribe software for clinical note transcription with the patient, who gave verbal consent to proceed.  History of Present Illness Hector Short is a 58 year old male who presents with concerns about coronary calcification.  He maintains a healthy lifestyle with a Mediterranean diet and regular exercise, including yoga and cycling. He has no symptoms of chest pain, shortness of breath, or exercise intolerance. There is no family history of coronary artery disease, but his father and brothers have elevated cholesterol levels.  Hypertension began in 2023 and is well-controlled with Amlodipine, which also alleviated Raynaud's symptoms.  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  He has been active all his life, but recently has had multiple issues with polyneuropathy and arthralgias.  He has also had hypermobility.  There is some family history of Ehlers-Danlos syndrome.  He has seen neurologists both in [place] and with [facility].  Clear etiology of his polyneuropathy has not been elucidated.  Overall symptoms are improved on hydroxychloroquine.      Vitals:   11/25/23 0946  BP: 129/77  Pulse: 68  SpO2: 98%      Review of Systems  Cardiovascular:  Negative for chest pain, dyspnea on exertion, leg swelling, palpitations and syncope.  Musculoskeletal:  Positive for joint pain.  Neurological:  Positive for numbness.        Studies Reviewed: SABRA        EKG 11/25/2023: Normal sinus rhythm Normal ECG When compared with ECG of  14-Mar-2005 14:01, Questionable change in QRS axis T wave amplitude has decreased in Inferior leads    CT cardiac scoring 08/2023: Coronary Calcium Score:  Left main: 0  Left anterior descending artery: 75 Left circumflex artery: 78.6 Right coronary artery: 19.4  Total score: 173 (81st percentile) Ascending aorta 39 mm (borderline dilatation)  Pericardium: Normal. Trivial aortic valve calcifications, AV calcium score is 2.    Labs 07/2023: Chol 176, TG 38, HDL 50, LDL 118 Cr 1.1 TSH 1.0  2024: HbA1C 5.5%   Physical Exam Vitals and nursing note reviewed.  Constitutional:      General: He is not in acute distress. Neck:     Vascular: No JVD.  Cardiovascular:     Rate and Rhythm: Normal rate and regular rhythm.     Heart sounds: Normal heart sounds. No murmur heard. Pulmonary:     Effort: Pulmonary effort is normal.     Breath sounds: Normal breath sounds. No wheezing or rales.  Musculoskeletal:     Right lower leg: No edema.     Left lower leg: No edema.      VISIT DIAGNOSES:   ICD-10-CM   1. Agatston coronary artery calcium score between 100 and 400  R93.1 EKG 12-Lead    Lipid panel    Lipoprotein A (LPA)    2. 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 male with  hypertension, elevated CAC, polyneuropathy, arthralgias Assessment & Plan Coronary artery calcification: Moderate calcification, asymptomatic. LDL 118 mg/dL, slightly elevated. Statin therapy considered to slow calcification progression. - Initiated low-dose statin therapy-Crestor 10 mg daily, monitored tolerance. - Check lipid panel and lipoprotein A in January. - Scheduled follow-up in six months.  Polyneuropathy, arthralgia: Unclear etiology.  He has also noticed hypermobility.  There is some family history of Ehlers-Danlos syndrome. I will refer to geneticist  Dr. Shella Pac to evaluate for any familial reactive tissue disorders.       Meds ordered this encounter  Medications   rosuvastatin (CRESTOR) 10 MG tablet    Sig: Take 1 tablet (10 mg total) by mouth daily.    Dispense:  90 tablet    Refill:  3     F/u in 6 months  Signed, Newman JINNY Lawrence, MD

## 2023-11-25 NOTE — Patient Instructions (Signed)
 Medication Instructions:  Your physician has recommended you make the following change in your medication:  START: Crestor 10 mg once daily  *If you need a refill on your cardiac medications before your next appointment, please call your pharmacy*  Lab Work: In January: Lipids, Lp(a) If you have labs (blood work) drawn today and your tests are completely normal, you will receive your results only by: MyChart Message (if you have MyChart) OR A paper copy in the mail If you have any lab test that is abnormal or we need to change your treatment, we will call you to review the results.  Follow-Up: At [facility], you and your health needs are our priority.  As part of our continuing mission to provide you with exceptional heart care, our providers are all part of one team.  This team includes your primary Cardiologist (physician) and Advanced Practice Providers or APPs (Physician Assistants and Nurse Practitioners) who all work together to provide you with the care you need, when you need it.  Your next appointment:   6 month(s)  Provider:   Newman JINNY Lawrence, MD

## 2024-01-25 ENCOUNTER — Other Ambulatory Visit: Payer: Self-pay

## 2024-01-25 DIAGNOSIS — R931 Abnormal findings on diagnostic imaging of heart and coronary circulation: Secondary | ICD-10-CM

## 2024-01-25 DIAGNOSIS — E782 Mixed hyperlipidemia: Secondary | ICD-10-CM

## 2024-01-26 LAB — LIPID PANEL
Chol/HDL Ratio: 2.2 ratio (ref 0.0–5.0)
Cholesterol, Total: 111 mg/dL (ref 100–199)
HDL: 51 mg/dL
LDL Chol Calc (NIH): 50 mg/dL (ref 0–99)
Triglycerides: 36 mg/dL (ref 0–149)
VLDL Cholesterol Cal: 10 mg/dL (ref 5–40)

## 2024-01-26 LAB — LIPOPROTEIN A (LPA): Lipoprotein (a): 28.1 nmol/L

## 2024-01-27 ENCOUNTER — Ambulatory Visit: Payer: Self-pay | Admitting: Cardiology

## 2024-05-17 ENCOUNTER — Ambulatory Visit: Admitting: Neurology
